# Patient Record
Sex: Male | Born: 1965 | Race: White | Hispanic: No | Marital: Single
Health system: Midwestern US, Community
[De-identification: ages and names within clinical notes are randomized; demographics above are authoritative.]

## PROBLEM LIST (undated history)

## (undated) DIAGNOSIS — R079 Chest pain, unspecified: Secondary | ICD-10-CM

## (undated) DIAGNOSIS — E785 Hyperlipidemia, unspecified: Secondary | ICD-10-CM

## (undated) DIAGNOSIS — I2510 Atherosclerotic heart disease of native coronary artery without angina pectoris: Secondary | ICD-10-CM

## (undated) DIAGNOSIS — I10 Essential (primary) hypertension: Secondary | ICD-10-CM

## (undated) DIAGNOSIS — R002 Palpitations: Secondary | ICD-10-CM

---

## 2001-07-07 HISTORY — PX: CORONARY STENT INTERVENTION: CATH118234

## 2014-01-10 LAB — PROTHROMBIN TIME + INR
INR: 1.1
Prothrombin time: 11.3 s (ref 9.8–12.0)

## 2014-01-10 LAB — EKG, 12 LEAD, INITIAL
Atrial Rate: 62 {beats}/min
Calculated P Axis: 69 degrees
Calculated R Axis: 82 degrees
Calculated T Axis: 72 degrees
Diagnosis: NORMAL
P-R Interval: 136 ms
Q-T Interval: 442 ms
QRS Duration: 120 ms
QTC Calculation (Bezet): 448 ms
Ventricular Rate: 62 {beats}/min

## 2014-01-10 LAB — CBC WITH AUTOMATED DIFF
ABS. BASOPHILS: 0 10*3/uL (ref 0.0–0.2)
ABS. EOSINOPHILS: 0.1 10*3/uL (ref 0.0–0.7)
ABS. LYMPHOCYTES: 0.9 10*3/uL — ABNORMAL LOW (ref 1.2–3.4)
ABS. MONOCYTES: 0.7 10*3/uL — ABNORMAL LOW (ref 1.1–3.2)
ABS. NEUTROPHILS: 3.6 10*3/uL (ref 1.4–6.5)
BASOPHILS: 1 % (ref 0–2)
EOSINOPHILS: 1 % (ref 0–5)
HCT: 31.7 % — ABNORMAL LOW (ref 36.8–45.2)
HGB: 10.4 g/dL — ABNORMAL LOW (ref 12.8–15.0)
IMMATURE GRANULOCYTES: 0.2 % (ref 0.0–5.0)
LYMPHOCYTES: 17 % (ref 16–40)
MCH: 29.3 PG (ref 27–31)
MCHC: 32.8 g/dL (ref 32–36)
MCV: 89.3 FL (ref 81–99)
MONOCYTES: 13 % — ABNORMAL HIGH (ref 0–12)
MPV: 9.6 FL (ref 7.4–10.4)
NEUTROPHILS: 68 % (ref 40–70)
PLATELET: 189 10*3/uL (ref 140–450)
RBC: 3.55 M/uL — ABNORMAL LOW (ref 4.0–5.2)
RDW: 12.8 % (ref 11.5–14.5)
WBC: 5.2 10*3/uL (ref 4.8–10.8)

## 2014-01-10 LAB — METABOLIC PANEL, COMPREHENSIVE
A-G Ratio: 1.2 (ref 1.0–3.1)
ALT (SGPT): 32 U/L (ref 12.0–78.0)
AST (SGOT): 37 U/L (ref 15–37)
Albumin: 4.2 g/dL (ref 3.40–5.00)
Alk. phosphatase: 58 U/L (ref 46–116)
Anion gap: 12 mmol/L (ref 10–17)
BUN/Creatinine ratio: 18 (ref 6.0–20.0)
BUN: 22 MG/DL — ABNORMAL HIGH (ref 7–18)
Bilirubin, total: 0.5 MG/DL (ref 0.20–1.00)
CO2: 28 mmol/L (ref 21–32)
Calcium: 8.7 MG/DL (ref 8.5–10.1)
Chloride: 102 mmol/L (ref 98–107)
Creatinine: 1.2 MG/DL (ref 0.6–1.3)
GFR est AA: >60 mL/min/{1.73_m2} (ref >60)
GFR est non-AA: >60 mL/min/{1.73_m2} (ref >60)
Globulin: 3.5 g/dL
Glucose: 108 mg/dL — ABNORMAL HIGH (ref 74–106)
Potassium: 4.3 mmol/L (ref 3.50–5.10)
Protein, total: 7.7 g/dL (ref 6.40–8.20)
Sodium: 138 mmol/L (ref 136–145)

## 2014-01-10 LAB — PTT: aPTT: 30.8 s (ref 24.5–31.6)

## 2014-01-10 LAB — TROPONIN I: Troponin-I: <0.05 ng/mL (ref <0.05)

## 2014-01-10 MED ORDER — SODIUM CHLORIDE 0.9 % IJ SYRG
Freq: Three times a day (TID) | INTRAMUSCULAR | Status: DC
Start: 2014-01-10 — End: 2014-01-10

## 2014-01-10 MED ORDER — SODIUM CHLORIDE 0.9 % IJ SYRG
INTRAMUSCULAR | Status: DC | PRN
Start: 2014-01-10 — End: 2014-01-10
  Administered 2014-01-10: 11:00:00 via INTRAVENOUS

## 2014-01-10 MED ORDER — MORPHINE 4 MG/ML SYRINGE
4 mg/mL | INTRAMUSCULAR | Status: DC | PRN
Start: 2014-01-10 — End: 2014-01-10

## 2014-01-10 MED ORDER — FAMOTIDINE (PF) 20 MG/2 ML IV
20 mg/2 mL | INTRAVENOUS | Status: AC
Start: 2014-01-10 — End: 2014-01-10
  Administered 2014-01-10: 12:00:00 via INTRAVENOUS

## 2014-01-10 MED ORDER — ASPIRIN 81 MG CHEWABLE TAB
81 mg | ORAL | Status: AC
Start: 2014-01-10 — End: 2014-01-10
  Administered 2014-01-10: 12:00:00 via ORAL

## 2014-01-10 MED FILL — SODIUM CHLORIDE 0.9 % IJ SYRG: INTRAMUSCULAR | Qty: 20

## 2014-01-10 MED FILL — SODIUM CHLORIDE 0.9 % INJECTION: INTRAMUSCULAR | Qty: 10

## 2014-01-10 MED FILL — ASPIRIN 81 MG CHEWABLE TAB: 81 mg | ORAL | Qty: 4

## 2014-01-10 MED FILL — MORPHINE 4 MG/ML SYRINGE: 4 mg/mL | INTRAMUSCULAR | Qty: 1

## 2014-01-10 NOTE — ED Notes (Signed)
Pt c/o L side CP radiates to left arm intermittent + nausea denies SOB describes pain as a pressure. EKG done and shown to Dr. Roxan Hockeyobinson

## 2014-01-10 NOTE — ED Notes (Signed)
Assumed care of patient,. Patient is alert and oriented times three.  Patient on monitor which shows sinus bradycardia without ectopy.

## 2014-01-10 NOTE — ED Provider Notes (Signed)
HPI Comments: Dennis Barrett is a 48 y.o. male who presents to the ED with complaint of CP since 04:30 this morning. He describes the pain as sharp and located in the substernal region of his chest. He also c/o mild SOB with his CP, but he denies N/V, diaphoresis, palpitations, dizziness, anxiety, and syncope. He took 3 SL NTG this morning with only minimal relief of his symptoms. He states that it took "the edge" off of his pain. He reports that he has been routinely treated for the past few years in [place]. The pt has a h/o CAD, 2 MI's, and 3 cardiac stents. The pt had a stress test within the last year that was slightly abnormal, but did not require any changes in his medical management or medical treatment at that time.    Patient is a 48 y.o. male presenting with chest pain. The history is provided by the patient. No language interpreter was used.   Chest Pain (Angina)   This is a new problem. The current episode started 3 to 5 hours ago. The problem has been gradually improving. The problem occurs constantly. The pain is present in the substernal region. The pain is moderate. The quality of the pain is described as sharp. The pain does not radiate. Associated symptoms include shortness of breath. Pertinent negatives include no abdominal pain, no back pain, no cough, no diaphoresis, no fever, no headaches, no leg pain, no malaise/fatigue, no nausea, no near-syncope, no numbness, no palpitations, no vomiting and no weakness. He has tried nitroglycerin for the symptoms. The treatment provided mild relief. Risk factors include cardiac disease. Procedural history includes cardiac catheterization, persantine thallium, exercise treadmill test and cardiac stents.       Past Medical History   Diagnosis Date   ??? MI (myocardial infarction) (HCC)    ??? Hypertension    ??? Hyperlipemia         History reviewed. No pertinent past surgical history.      History reviewed. No pertinent family history.     History      Social History   ??? Marital Status: SINGLE     Spouse Name: N/A     Number of Children: N/A   ??? Years of Education: N/A     Occupational History   ??? Not on file.     Social History Main Topics   ??? Smoking status: Never Smoker    ??? Smokeless tobacco: Not on file   ??? Alcohol Use: No   ??? Drug Use: No   ??? Sexual Activity: Not on file     Other Topics Concern   ??? Not on file     Social History Narrative   ??? No narrative on file                  ALLERGIES: Pcn      Review of Systems   Constitutional: Negative.  Negative for fever, chills, malaise/fatigue and diaphoresis.   HENT: Negative.  Negative for congestion, ear pain, sore throat and trouble swallowing.    Eyes: Negative.  Negative for photophobia, pain, redness and visual disturbance.   Respiratory: Positive for shortness of breath. Negative for cough, chest tightness and wheezing.    Cardiovascular: Positive for chest pain. Negative for palpitations and near-syncope.   Gastrointestinal: Negative.  Negative for nausea, vomiting, abdominal pain, diarrhea and blood in stool.   Genitourinary: Negative for dysuria and frequency.   Musculoskeletal: Positive for arthralgias (Chronic LEFT knee  pain). Negative for back pain, joint swelling and neck pain.   Skin: Negative.    Neurological: Negative.  Negative for seizures, syncope, weakness, numbness and headaches.   Psychiatric/Behavioral: Negative.  Negative for behavioral problems and confusion. The patient is not nervous/anxious.    All other systems reviewed and are negative.      Filed Vitals:    01/10/14 0822   BP: 112/75   Pulse: 59   Resp: 19   Height: 5\' 11"  (1.803 m)   Weight: 77.111 kg (170 lb)   SpO2: 100%            Physical Exam   Constitutional: He is oriented to person, place, and time.   Thin male   HENT:   Head: Normocephalic and atraumatic.   Mouth/Throat: No oropharyngeal exudate.   Eyes: Conjunctivae and EOM are normal. Pupils are equal, round, and  reactive to light. Right eye exhibits no discharge. Left eye exhibits no discharge. No scleral icterus.   Neck: Normal range of motion. Neck supple.   Cardiovascular: Normal rate, regular rhythm and normal heart sounds.  Exam reveals no gallop and no friction rub.    No murmur heard.  Pulmonary/Chest: Effort normal and breath sounds normal. No respiratory distress. He has no wheezes. He has no rales.   Abdominal: Soft. Bowel sounds are normal. He exhibits no distension. There is no tenderness. There is no rebound and no guarding.   Musculoskeletal: Normal range of motion. He exhibits no edema or tenderness.   Neurological: He is alert and oriented to person, place, and time. No cranial nerve deficit. Coordination normal.   Pt ambulatory without difficulty.    5/5 BUE/BLE strength.    Cranial nerves II-XII intact.    Speech normal.    Sensations intact.     Skin: Skin is warm and dry. No rash noted.   Psychiatric: He has a normal mood and affect. His behavior is normal. Judgment and thought content normal.   Nursing note and vitals reviewed.       MDM  Number of Diagnoses or Management Options  Chest pain, unspecified chest pain type:   Diagnosis management comments: 48 y.o. male with a h/o CAD who presents with an episode of CP beginning approximately 2 hours ago. Pt is not forthcoming with his medical history. I observed old Tegaderm markings and EKG sites on the pt's chest wall. When asked about those markings, the pt sates that those marks are from a hospital visit 3 months ago. The pt's medical records were reviewed in [facility], and the pt has been to three hospitals over the past 24 hours, and 5 hospitals over the past 2 days. During each of these subsequent visits, the pt has had multiple enzymes drawn over the past 24 hours. He has also had multiple stress tests and MRI's of the brain within the past few months. [place] [address] within the past 3-5 months, and has had negative cardiac work-ups at those sites.     Will order a set of troponin enzymes to r/o acute MI. However, with the pt's lack of forthcoming information and recent normal cardiac tests, cardiac disease is of low concern. Will ask the pt if he has come to the ED for secondary gain or for secondary reasons.      9:39 AM  The pt's troponin enzymes are negative. The pt has had other troponin enzymes drawn  over the past 24 hours at 2 other hospital facilities. Considering these normal serial troponins at different hospitals within the past 24 hours, the pt has been ruled out for acute MI. Therefore, there is no reason at this time to obtain a repeat troponin during his visit in the ED today.       Plan:   CBC  BMP  Troponin  EKG  CXR  Monitor for three hours  Reassess  -----  7:19 AM  Documented by Alric Seton, acting as a scribe for Dr. Saundra Shelling.    PROVIDER ATTESTATION:  11:08 AM    The entirety of this note, signed by me, accurately reflects all works, treatments, procedures, and medical decision making performed by me, Lawana Chambers, MD.              Patient Progress  Patient progress: stable      Diagnostic Studies:  EKG 7:09 AM   Viewed by me  Interpreted by me  Abnormal as below:    Normal Sinus Rhythm   Normal rate    Normal Axis   Normal ST/T waves   Normal QRS   Normal Intervals   Tachycardic @ rate of:            bpm   Bradycardic @ rate of:             bpm   Nonspecific T-wave inversions   Nonspecific ST changes    LVH     RBBB  LBBB  Paced   Interpretation: 62 NSR; TWI in V1 and V2; no signs of ischemia    CXR 7:09 AM    Viewed by me     Interpreted by me   No acute disease    Abnormal : see below    Discussed with       Radiologist      Normal mediastinum  No infiltrates   Normal heart size   Interpretation: NAD; loop recorder present under the skin      Lab Data:  Labs Reviewed    METABOLIC PANEL, COMPREHENSIVE - Abnormal; Notable for the following:     Glucose 108 (*)     BUN 22 (*)     All other components within normal limits   CBC WITH AUTOMATED DIFF - Abnormal; Notable for the following:     RBC 3.55 (*)     HGB 10.4 (*)     HCT 31.7 (*)     MONOCYTES 13 (*)     ABS. LYMPHOCYTES 0.9 (*)     ABS. MONOCYTES 0.7 (*)     All other components within normal limits   TROPONIN I   PTT   PROTHROMBIN TIME + INR         ED Course:   7:09 AM  [facility] records reviewed. D/w pt that these records were reviewed. D/w pt that the troponin enzymes drawn at [facility] are not consistent with a CC of knee pain, as he states he was at [facility] for knee pain. The pt is not forthcoming with his medical hx or his secondary reasons for coming to the ED at [facility] today. The pt had a normal stress test during an exertional treadmill study on 11/09/13 at 60% of maximum potential HR. The pt was also seen at [facility] for knee pain 10 hours ago. The pt had a persantine thallium stress test at [facility] in March, 2015 that was also normal.  Within the past 24 hours, the pt has been to 3 hospitals in the [place] area, and 5 hospitals over the past 2 days.     9:35 AM  The pt's troponin enzymes are negative. The pt has had other troponin enzymes drawn over the past 24 hours at 2 other facilities. Considering these normal serial troponins at different hospitals within the past 24 hours, the pt has been ruled out for acute MI. Therefore, there is no reason at this time to obtain a repeat troponin during his visit in the ED today.     Procedures

## 2014-01-10 NOTE — ED Notes (Signed)
I have reviewed discharge instructions with the patient.  The patient verbalized understanding.

## 2014-01-10 NOTE — ED Notes (Signed)
Patient remains pain free, given warm blankets and a meal to consume.  Patient awaiting reassessment by physician after all lab results are obtained.Patient remain on cardiac monitor which shows sinus bradycardia without ectopy.

## 2014-01-12 ENCOUNTER — Inpatient Hospital Stay
Admit: 2014-01-12 | Discharge: 2014-01-15 | Disposition: A | Discharge status: Home / Self Care | Payer: MEDICARE | Source: Other Acute Inpatient Hospital | Attending: Addiction Medicine | Admitting: Addiction Medicine

## 2014-01-12 DIAGNOSIS — F3289 Other specified depressive episodes: Secondary | ICD-10-CM

## 2014-01-12 NOTE — Progress Notes (Signed)
Admission  Note :                         Pt, a 48 year old caucasian male was voluntarily admitted to [facility] unit from [facility]) this evening. Pt was already on the unit when this nurse assumed duty. Per report from [facility] ED, pt self presents to the ED with complaints of increased depression and SI. Pt caught his girlfriend of 7 years in bed with another man the previous day before presenting to the ED. Pt also reported he has been working long days, 10-16 hours per day every day and he is feeling overworked,  exhausted and "run down". 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 Pt was pleasant upon approach. He was cooperative and calm with the admission process. Denies SI/HI at this time. Pt also denies AH/VH. Per pt, he said he feels more overwhelmed with work than feeling depressed. He complained of decreased sleep as he has been sleeping for only 1-2 hours per day and would like to get some rest. This is pt's first in-patient hospitalization as he had no prior in-patient and out-patient treatment for behavioral health. Pt also has no history of alcohol or substance abuse and denies use of any. Pt's PMHX is significant for hypertension, coronary artery disease,  and myocardial infarction. He does take medications for these somatic issues and is compliant. Dr. Freida Busman was notified of pt's arrival on the unit and of the admission assessment and admission order was placed.

## 2014-01-13 MED ADMIN — simvastatin (ZOCOR) tablet 40 mg: ORAL | @ 16:00:00 | NDC 68084051211

## 2014-01-13 MED ADMIN — clopidogrel (PLAVIX) tablet 75 mg: ORAL | @ 16:00:00 | NDC 00904629461

## 2014-01-13 MED ADMIN — metoprolol (LOPRESSOR) tablet 25 mg: ORAL | @ 16:00:00 | NDC 62584026511

## 2014-01-13 MED FILL — CLOPIDOGREL 75 MG TAB: 75 mg | ORAL | Qty: 1

## 2014-01-13 MED FILL — SIMVASTATIN 20 MG TAB: 20 mg | ORAL | Qty: 2

## 2014-01-13 MED FILL — METOPROLOL TARTRATE 25 MG TAB: 25 mg | ORAL | Qty: 1

## 2014-01-13 NOTE — H&P (Addendum)
DATE OF ADMISSION: 01/12/2014    ATTENDING PHYSICIAN: Reather Littler, MD    INFORMATION SOURCE: Chart reviewed, patient interviewed.    CHIEF COMPLAINT: "I caught my girlfriend cheating."    HISTORY OF PRESENT ILLNESS: The patient is a 48 year old, single,Caucasian  male who presented to [facility] [place].  Dennis Barrett, transferred from [facility] where he  self-presented from a friend's job via walking to [facility] for patient's  complaints of feeling depressed, exhausted, puzzled and disappointed for  approximately 24 hours, with suicidal ideation without plan. The patient  denies any history of suicidal ideation or attempts in the past. He denied  any neurovegetative symptoms. He endorsed recent stressors of girlfriend  cheating. They have been together for 7 years. The cheating event, he  reports, took place the night before this admission. 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 He stepped out of the hotel and, when he returned found his  Ex-significant other"in bed with a man he did not know." The patient reports that he  immediately left the room and engaged the advice of his friend. The patient  reports that since this event his son's have removed his belongings and are  traveling back with the construction crew to his home state of [place]. The patient reports he plans to have no further association with  his now ex-significant other.    PAST PSYCHIATRIC HISTORY: The patient denies.    PAST MEDICAL HISTORY: MI x2, hypercholesterolemia, hypertension.    MEDICATIONS  1. Plavix 75 mg a day.  2. Lopressor 25 mg daily.  3. Zocor 40 mg daily.  4. Aspirin 81 mg daily.    ALLERGIES: PENICILLIN.    FAMILY PSYCHIATRIC HISTORY: None.    SOCIAL HISTORY: The patient resided in house with ex-significant other in   [place]. Employed. 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 No legal  concerns. Two years of college education. No military history. Never  married, 6 children.    SUBSTANCE USE HISTORY: The patient denies.    MENTAL STATUS EXAMINATION ON ADMISSION: The patient's gait is stable.  Oriented to person, place, time and situation. Appearance: Mildly  disheveled. Eye contact: Good. Attitude: Initially guarded, then within  normal limits. Behavior: Normal. Speech: Normal. Mood: Mildly anxious.  Affect: Full. Thought process: Goal directed. Abstraction: Intact.  Associations: Intact. Thought content with suicidal ideation without plan  at time of admission. Homicidal ideation: None. Delusions: None.  Perceptions: None. Attention and concentration: Intact. Fund of knowledge:  Appropriate. Memory: Intact. Insight: Blaming. Judgment: Psychologically  impaired. Language: Age appropriate.    ASSESSMENT  AXIS I: Depressive disorder, unspecified.  AXIS II: Deferred.  AXIS III  1. MI x2.  2. Hypercholesterolemia.  3. Hypertension.  AXIS IV: Recent alleged infidelity of significant other of 7 years.  AXIS V: 35-40.    PLAN: Admit to [facility]. Constant observation. Somatic evaluation.  Psychosocial assessment. Encourage verbalization of feelings. Obtain  collateral information. Initiate disposition plan.        Reather Littler MD        Dictated by: Mallory Shirk Martena Emanuele]  Job#: [[id number]]  DD: [01/13/2014 02:02 P]  DT: [01/13/2014 02:32 P]  Transcribed by: [wmx]    cc:            Alvan Culpepper,MD Freida Busman

## 2014-01-13 NOTE — Other (Signed)
Bedside and Verbal shift change report given by MOJISOLA A. SOFOLUKE   (offgoing nurse).  Report given with SBAR, Kardex and MAR.

## 2014-01-13 NOTE — H&P (Signed)
Pt interviewed for H&P.    Luc Shammas, MD

## 2014-01-13 NOTE — Consults (Signed)
ATTENDING PHYSICIAN: Reather Littlerhonda Allen, MD    CARDIOLOGIST: [facility]obs [facility].    CONSULTING PHYSICIAN: Gustavus MessingSteven Hamlette, MD    REASON FOR CONSULTATION: Medical management.    HISTORY OF PRESENT ILLNESS: The patient is a 48 year old male with past  medical history significant for CAD, hypertension, hyperlipidemia, who  presented to the emergency department with complaint of increasing  depression and suicidal ideation. The patient was transferred to the  psychiatric unit here at [facility] for further evaluation and management.  Today, the patient denies any suicidal or homicidal ideations. He also  denies any chest pain, shortness of breath, nausea, vomiting, diarrhea, or  abdominal pain.    PAST MEDICAL HISTORY: CAD, hypertension, hyperlipidemia.    PAST SURGICAL HISTORY: The patient denies any surgery.    FAMILY HISTORY: Positive for hypertension.    SOCIAL HISTORY: The patient denies any tobacco, alcohol, or illicit drug  use.    MEDICATIONS  1. Plavix 75 mg by mouth daily.  2. Atorvastatin 40 mg by mouth daily.  3. Metoprolol 100 mg by mouth b.i.d.  4. Aspirin 81 mg by mouth daily.    ALLERGIES: NO KNOWN DRUG ALLERGIES.    REVIEW OF SYSTEMS: A 10-point review of systems was reviewed with the  patient and otherwise negative except as noted above in the HPI.    PHYSICAL EXAMINATION  VITAL SIGNS: Temperature 98.1, pulse 62, respiratory rate 20, blood  pressure 112/72. Oxygen saturation 100 on room air.  GENERAL: A well-developed, well-nourished 48 year old male who appears to  be in no acute distress.  HEENT: Atraumatic, normocephalic. Pupils equal, round, and reactive to  light and accommodation. EOMs intact. Poor dentition.  NECK: Supple. No JVD, lymphadenopathy, no thyromegaly, no carotid bruits.  CHEST: No deformities.  CARDIOVASCULAR: S1, S2, regular rate and rhythm.  RESPIRATORY: Clear to auscultation bilaterally.  No wheezes or rhonchi.   GASTROINTESTINAL: Abdomen soft, nontender, nondistended. Positive bowel  sounds.  MUSCULOSKELETAL: Full range of motion intact.  EXTREMITIES: No edema, cyanosis, or clubbing.  PSYCHIATRIC: Appropriate mood.  SKIN: No apparent lesions.  NEUROLOGIC: The patient is alert and oriented x3. No apparent focal  deficits.    LABORATORY DATA: WBC 5.2, hemoglobin 10.4, hematocrit 31.7, platelets 189.  Sodium 138, potassium 4.3, chloride 102, CO2 28, anion gap 12, glucose 108,  BUN 22, creatinine 1.2, calcium 8.7, total protein 7.7, albumin 4.2, ALT  32, AST 37, alkaline phosphatase 58. Total protein 7.7, albumin 4.2, ALT  32, AST 37, alkaline phosphatase 58. Troponin less than 0.05. INR 1.1,  prothrombin time 11.3, PTT 30.8.    Chest x-ray revealed no evidence of active disease.    EKG tracing reviewed and revealed normal sinus rhythm at a rate of 62.    CODE STATUS: FULL CODE.    ASSESSMENT  1. Renal insufficiency, likely secondary to dehydration.  2. Dehydration.  3. Coronary artery disease.  4. Hyperlipidemia.  5. Normocytic anemia. Hemoglobin and hematocrit stable.  6. Hypertension.  7. Mood disorder.  8. Depressive disorder.  9. Suicidal ideations.    Note, all the above diagnoses were present on admission.    RECOMMENDATIONS: This is a patient who presented with renal insufficiency.  BUN is 22 and creatinine 1.2, likely secondary to dehydration. We will  encourage oral hydration. We will monitor kidney functions closely. The  patient also has a history of anemia, but H and H is currently stable.  There is no sign of active bleeding. We will monitor closely. We will  resume the patient's Plavix. We will resume statin for hyperlipidemia and  metoprolol for blood pressure and will monitor blood pressure very closely.  His blood pressure is currently well controlled. The psychiatric diagnoses  mentioned above are being managed by the psychiatric team.    On behalf of [facility] hospitalist team, thank you for this consultation.         Ramonita Koenig PA-C        Dictated by: Lavella Hammock[Leeta Grimme, PA-C Lyndzee Kliebert]  Job#: [[id number]]: [id number]]  DD: [01/13/2014 04:36 P]  DT: [01/13/2014 05:10 P]  Transcribed by: [wmx]    cc:            Rhonda,MD Crissie ReeseAllen                 Shantil Vallejo, PA-C Aaric Dolph

## 2014-01-13 NOTE — Progress Notes (Signed)
01/13/14 2031   Attitude and Behavior   General Attitude Cooperative   Affect Angry   Mood Angry;Anxious   Insight Fair   Judgement Impaired   Memory  Intact   Thought Content Delusions   Hallucinations None   Delusions Paranoid   Concentration Fair   Speech Pattern Unremarkable   Thought Process Unremarkable   Motor Activity Unremarkable   pt is alert and oriented x3, pt is paranoid and delusional, pt reports he is not happy with the decision to pull down the confederate flag in [place]. Pt reports he "knows better than those people do".pt reports the flag is very important in the history of [place], and should be left alone. Pt reports he will have to do something about the flag when he returns to [place] [place]. Pt was visible in the milieu last night, interacted appropriately with selected peers, pt denies pain and  si/hi this shift. Pt slept through the night, will continue to provide for safety, Q5215mins checks maintained.

## 2014-01-13 NOTE — Progress Notes (Signed)
01/13/14 1553   Attitude and Behavior   General Attitude Cooperative   Affect Flat;Dull;Sad   Mood Depressed   Insight Fair   Judgement Impaired   Memory  Intact   Thought Content Unremarkable   Hallucinations None   Delusions None   Concentration Fair   Speech Pattern Unremarkable   Thought Process Unremarkable   Motor Activity Unremarkable     Assumed care of patient this am; pt is  alert and oriented x 3. Patient is visible in the milieu but o self, observed sitting in the dayarea watching TV at times; Denies SI/HI/AV hallucinations currently; able to contract for safety on the unit; compliant with scheduled medications; ate 100% of meals; no safety or behavior issues noted or reported; Will continue to monitor.

## 2014-01-13 NOTE — Progress Notes (Addendum)
Pt denies use of any illegal substance. However, his urine toxicology was positive for opiates and oxycodone.

## 2014-01-13 NOTE — Progress Notes (Signed)
Pt rested uninterrupted through the night for approximately 6. 5 hours. Pt woke up at 7:07 am asking for breakfast. Pt was told to remain in bed till 8:00 am when breakfast will be served.

## 2014-01-13 NOTE — Consults (Signed)
Consult dictated. Job ID#: [id number]

## 2014-01-14 LAB — METABOLIC PANEL, BASIC
Anion gap: 12 mmol/L (ref 10–17)
BUN/Creatinine ratio: 13 (ref 6.0–20.0)
BUN: 14 MG/DL (ref 7–18)
CO2: 27 mmol/L (ref 21–32)
Calcium: 8.2 MG/DL — ABNORMAL LOW (ref 8.5–10.1)
Chloride: 110 mmol/L — ABNORMAL HIGH (ref 98–107)
Creatinine: 1.1 MG/DL (ref 0.6–1.3)
GFR est AA: >60 mL/min/{1.73_m2} (ref >60)
GFR est non-AA: >60 mL/min/{1.73_m2} (ref >60)
Glucose: 101 mg/dL (ref 74–106)
Potassium: 4.1 mmol/L (ref 3.50–5.10)
Sodium: 145 mmol/L (ref 136–145)

## 2014-01-14 MED ADMIN — metoprolol (LOPRESSOR) tablet 25 mg: ORAL | @ 13:00:00 | NDC 62584026511

## 2014-01-14 MED ADMIN — clopidogrel (PLAVIX) tablet 75 mg: ORAL | @ 13:00:00 | NDC 68084053611

## 2014-01-14 MED ADMIN — aspirin delayed-release tablet 81 mg: ORAL | @ 13:00:00 | NDC 00603002622

## 2014-01-14 MED ADMIN — simvastatin (ZOCOR) tablet 40 mg: ORAL | @ 13:00:00 | NDC 68084051211

## 2014-01-14 MED FILL — METOPROLOL TARTRATE 25 MG TAB: 25 mg | ORAL | Qty: 1

## 2014-01-14 MED FILL — SIMVASTATIN 20 MG TAB: 20 mg | ORAL | Qty: 2

## 2014-01-14 MED FILL — CLOPIDOGREL 75 MG TAB: 75 mg | ORAL | Qty: 1

## 2014-01-14 MED FILL — ASPIRIN 81 MG TAB, DELAYED RELEASE: 81 mg | ORAL | Qty: 1

## 2014-01-14 NOTE — Other (Signed)
Bedside and Verbal shift change report given to  (oncoming nurse) by Eric A Amene, RN (offgoing nurse). Report included the following information SBAR, Kardex and MAR.

## 2014-01-14 NOTE — Progress Notes (Signed)
Pt alert and pleasant; cooperative with assessment.  He is med-compliant and visible in the milieu.  He interacts with his peers.  Pt says that he is here due to feeling depressed and suicidal.  He says that he no longer feels that way because he has gotten some rest.  Denies feeling depressed and states, "I feel good."  No paranoia or delusions noted.  Will continue q15 mins close observation.

## 2014-01-14 NOTE — Progress Notes (Signed)
Bedside shift change report given by NINA  POLLEY, RN   (offgoing nurse).  Report given with SBAR and Kardex.

## 2014-01-14 NOTE — Progress Notes (Signed)
PSYCHIATRY INPATIENT PROGRESS NOTE    SUBJECTIVE/Objective: Pt presents with mild dysphoria, mildly restricted affect and reports continued dismay about SO infidelity.  He is able to verbalize his feelings. He denies SI, HI or AH.  Pt hopes to relocate to [facility] at discharge.  SW for dispo plan.    Current facility-administered medications: metoprolol (LOPRESSOR) tablet 25 mg, 25 mg, Oral, DAILY, Reather Littler, MD, 25 mg at 01/14/14 0840;  clopidogrel (PLAVIX) tablet 75 mg, 75 mg, Oral, DAILY, Reather Littler, MD, 75 mg at 01/14/14 0840;  simvastatin (ZOCOR) tablet 40 mg, 40 mg, Oral, DAILY, Reather Littler, MD, 40 mg at 01/14/14 0840;  aspirin delayed-release tablet 81 mg, 81 mg, Oral, DAILY, Reather Littler, MD, 81 mg at 01/14/14 0840      Allergies   Allergen Reactions   ??? Pcn [Penicillins] Hives        MENTAL STATUS EXAMINATION : The patient's gait is stable.  Oriented to person, place, time and situation. Appearance: Mildly  disheveled. Eye contact: Good. Attitude: less guarded, then within  normal limits. Behavior: Normal. Speech: Normal. Mood: Mildly anxious.  Affect: Full. Thought process: Goal directed. Abstraction: Intact.  Associations: Intact. Thought content without suicidal ideation. Homicidal ideation: None. Delusions: None.  Perceptions: None. Attention and concentration: Intact. Fund of knowledge:  Appropriate. Memory: Intact. Insight: Blaming. Judgment: Psychologically  impaired. Language: Age appropriate.        Labs reviewed.  Blood pressure 115/77, pulse 58, temperature 97.3 ??F (36.3 ??C), resp. rate 18, height 5' 7.75" (1.721 m), weight 64 kg (141 lb 1.5 oz), SpO2 96 %.      IMPRESSION AND PLAN:   Patient Active Problem List   Diagnosis Code   ??? Mood disorder (HCC) 296.90       1. Continue inpatient psychiatric hospitalization on level  routine precautions.  2. Continue current medications,discussed medication risk benefit profile and potential side effects.    3. Medication reconciliation completed with the patient and within the EMR. Medication education supportive psychotherapy. MH Relapse prevention eduction, continue to build rapport.  4. Continue discharge planning.     Reather Littler, MD   01/14/2014 10:32 AM

## 2014-01-15 MED ORDER — METOPROLOL TARTRATE 25 MG TAB
25 mg | ORAL_TABLET | Freq: Every day | ORAL | Status: AC
Start: 2014-01-15 — End: ?

## 2014-01-15 MED ADMIN — clopidogrel (PLAVIX) tablet 75 mg: ORAL | @ 12:00:00 | NDC 68084053611

## 2014-01-15 MED ADMIN — simvastatin (ZOCOR) tablet 40 mg: ORAL | @ 12:00:00 | NDC 68084051211

## 2014-01-15 MED ADMIN — aspirin delayed-release tablet 81 mg: ORAL | @ 12:00:00 | NDC 00603002622

## 2014-01-15 MED ADMIN — metoprolol (LOPRESSOR) tablet 25 mg: ORAL | @ 12:00:00 | NDC 62584026511

## 2014-01-15 MED FILL — ASPIRIN 81 MG TAB, DELAYED RELEASE: 81 mg | ORAL | Qty: 1

## 2014-01-15 MED FILL — METOPROLOL TARTRATE 25 MG TAB: 25 mg | ORAL | Qty: 1

## 2014-01-15 MED FILL — CLOPIDOGREL 75 MG TAB: 75 mg | ORAL | Qty: 1

## 2014-01-15 MED FILL — SIMVASTATIN 20 MG TAB: 20 mg | ORAL | Qty: 2

## 2014-01-15 NOTE — Other (Signed)
Bedside shift change report given by Timothy Udoji, RN   (offgoing nurse).  Report given with SBAR, Kardex and MAR.

## 2014-01-15 NOTE — Progress Notes (Signed)
D/C NOTE:  Pt has been escorted off the unit by this Clinical research associate.  All personal belongings have been released to pt.  He has been transported to [place] bus terminal by taxicab.  He is in agreement with aftercare plan.

## 2014-01-15 NOTE — Behavioral Health Treatment Team (Signed)
BEHAVIORAL HEALTH NURSING DISCHARGE NOTE      The following personal items collected during your admission are returned to you:   Dental Appliance: Dental Appliances: None  Vision: Visual Aid: None  Hearing Aid:    Jewelry: Jewelry: None  Clothing: Clothing: None  Other Valuables: Other Valuables: None  Valuables sent to safe:        PATIENT INSTRUCTIONS:      The discharge information has been reviewed with the patient.  The patient verbalized understanding.

## 2014-01-15 NOTE — Progress Notes (Signed)
Pt alert and pleasant; cooperative with interview.  He is for discharge today.  Denies SI,HI, denies suicide plan.  Denies aud and vis hallucinations.  Pt denies feeling depressed.  Says he will go to [place] in [place] and from [place].  Pt is to follow up with Dr Christella HartiganJacobs in his home town and continue of his prior to admission meds.  Pt in agreement with aftercare plan.  Will continue q15 mins close observation.

## 2014-01-15 NOTE — Progress Notes (Signed)
01/15/14 0544   Attitude and Behavior   General Attitude Cooperative   Affect Dull   Mood Anxious   Insight Fair   Judgement Intact   Memory  Intact   Thought Content Unremarkable   Hallucinations None   Delusions None   Concentration Fair   Speech Pattern Slow   Thought Process Unremarkable   Motor Activity Freedom of movement   Pt was alert and oriented, was cooperative . He spent an ample length of time in the milieu watching TV. He denies SI/HI, stated that he is doing better. He retired to bed when the unit was shut. He had an uninterrupted nights rest, 15 minutes checks maintained.

## 2014-01-18 NOTE — Discharge Summary (Signed)
DATE OF DISCHARGE: 01/15/2014    ATTENDING PHYSICIAN: Reather Littlerhonda Sherae Santino, MD    REASON FOR ADMISSION:   CHIEF COMPLAINT: "I caught my girlfriend cheating."    HISTORY OF PRESENT ILLNESS: The patient is a 48 year old, single,Caucasian  male who presented to [facility], [facility] where he  self-presented from a friend's job via walking to [facility] for patient's  complaints of feeling depressed, exhausted, puzzled and disappointed for  approximately 24 hours, with suicidal ideation without plan. The patient  denies any history of suicidal ideation or attempts in the past. He denied  any neurovegetative symptoms. He endorsed recent stressors of girlfriend  cheating. They have been together for 7 years. The cheating event, he  reports, took place the night before this admission. The patient [place] in  [place] and has a [employer] that works, traveling around  the country, and recently they were doing a job in [place] lodged in a  hotel. He stepped out of the hotel and, when he returned found his  Ex-significant other"in bed with a man he did not know." The patient reports that he  immediately left the room and engaged the advice of his friend. The patient  reports that since this event his son's have removed his belongings and are  traveling back with the construction crew to [place]. The patient reports he plans to have no further association with  his now ex-significant other.    PAST PSYCHIATRIC HISTORY: The patient denies.    PAST MEDICAL HISTORY: MI x2, hypercholesterolemia, hypertension.          For full details, please see psychiatric assessment  completed by Dr. Freida BusmanAllen on 01/13/2014.    FINAL DIAGNOSES  AXIS I: Depressive disorder, unspecified.  AXIS II: Deferred.  AXIS III: Myocardial infarction x2, hypercholesterolemia, hypertension.   AXIS IV: Recent alleged infidelity of significant other of 7 years.  AXIS V: 35-40 on admission, 45-50 at discharge.    HOSPITAL COURSE: The patient was treated with individual psychotherapy. The  patient was able to integrate into the milieu without complications. When  it was noted that the patient was no longer a danger to himself or others,  and targeted symptoms could be treated in the community, it was the  decision of the treatment team that the patient's targeted symptoms could  be treated in the community.    DISCHARGE MEDICATIONS  1. Lopressor 25 mg 1 tablet by mouth daily.  2. Lipitor 40 mg 1 tablet by mouth daily.  3. Aspirin 81 mg 1 tablet by mouth daily.  4. Plavix one tablet by mouth daily.    DISPOSITION: Patient discharged to [employer] bus station to [place] resume [place] and to follow up with primary care  provider in [place]. The patient understood and agreed with  disposition plan.            Reather LittlerHONDA Brycen Bean MD        Dictated by: Mallory Shirk[Calista Crain,MD Davey Bergsma]  Job#: [[id number]]: [id number]]  DD: [01/17/2014 08:38 P]  DT: [01/18/2014 02:58 A]  Transcribed by: [wmx]    cc:            Hulet Ehrmann,MD Freida BusmanAllen

## 2014-02-15 LAB — EKG, 12 LEAD, INITIAL
Atrial Rate: 59 {beats}/min
Calculated P Axis: 71 degrees
Calculated R Axis: 76 degrees
Calculated T Axis: 66 degrees
P-R Interval: 128 ms
Q-T Interval: 454 ms
QRS Duration: 128 ms
QTC Calculation (Bezet): 449 ms
Ventricular Rate: 59 {beats}/min

## 2014-02-15 LAB — CBC WITH AUTOMATED DIFF
ABS. BASOPHILS: 0.1 10*3/uL (ref 0.0–0.1)
ABS. EOSINOPHILS: 0.5 10*3/uL — ABNORMAL HIGH (ref 0.0–0.4)
ABS. LYMPHOCYTES: 1.2 10*3/uL (ref 0.8–3.5)
ABS. MONOCYTES: 0.7 10*3/uL (ref 0.0–1.0)
ABS. NEUTROPHILS: 2.5 10*3/uL (ref 1.8–8.0)
BASOPHILS: 1 % (ref 0–1)
EOSINOPHILS: 11 % — ABNORMAL HIGH (ref 0–7)
HCT: 29.5 % — ABNORMAL LOW (ref 36.6–50.3)
HGB: 9.3 g/dL — ABNORMAL LOW (ref 12.1–17.0)
LYMPHOCYTES: 24 % (ref 12–49)
MCH: 28.3 PG (ref 26.0–34.0)
MCHC: 31.5 g/dL (ref 30.0–36.5)
MCV: 89.7 FL (ref 80.0–99.0)
MONOCYTES: 14 % — ABNORMAL HIGH (ref 5–13)
NEUTROPHILS: 50 % (ref 32–75)
PLATELET: 213 10*3/uL (ref 150–400)
RBC: 3.29 M/uL — ABNORMAL LOW (ref 4.10–5.70)
RDW: 14.4 % (ref 11.5–14.5)
WBC: 5 10*3/uL (ref 4.1–11.1)

## 2014-02-15 LAB — CK W/ CKMB & INDEX
CK - MB: 1.3 NG/ML (ref 0.5–3.6)
CK - MB: 0.9 NG/ML (ref 0.5–3.6)
CK-MB Index: 0.6 (ref 0–2.5)
CK-MB Index: 0.4 (ref 0–2.5)
CK: 236 U/L (ref 39–308)
CK: 201 U/L (ref 39–308)

## 2014-02-15 LAB — METABOLIC PANEL, COMPREHENSIVE
A-G Ratio: 1.1 (ref 1.1–2.2)
ALT (SGPT): 19 U/L (ref 12–78)
AST (SGOT): 20 U/L (ref 15–37)
Albumin: 3.7 g/dL (ref 3.5–5.0)
Alk. phosphatase: 57 U/L (ref 45–117)
Anion gap: 9 mmol/L (ref 5–15)
BUN/Creatinine ratio: 17 (ref 12–20)
BUN: 12 MG/DL (ref 6–20)
Bilirubin, total: 1 MG/DL (ref 0.2–1.0)
CO2: 25 mmol/L (ref 21–32)
Calcium: 8.8 MG/DL (ref 8.5–10.1)
Chloride: 106 mmol/L (ref 97–108)
Creatinine: 0.71 MG/DL (ref 0.45–1.15)
GFR est AA: >60 mL/min/{1.73_m2} (ref >60)
GFR est non-AA: >60 mL/min/{1.73_m2} (ref >60)
Globulin: 3.5 g/dL (ref 2.0–4.0)
Glucose: 87 mg/dL (ref 65–100)
Potassium: 3.1 mmol/L — ABNORMAL LOW (ref 3.5–5.1)
Protein, total: 7.2 g/dL (ref 6.4–8.2)
Sodium: 140 mmol/L (ref 136–145)

## 2014-02-15 LAB — EKG, 12 LEAD, SUBSEQUENT
Atrial Rate: 56 {beats}/min
Calculated P Axis: 67 degrees
Calculated R Axis: 78 degrees
Calculated T Axis: 67 degrees
P-R Interval: 126 ms
Q-T Interval: 460 ms
QRS Duration: 126 ms
QTC Calculation (Bezet): 443 ms
Ventricular Rate: 56 {beats}/min

## 2014-02-15 LAB — TROPONIN I
Troponin-I, Qt.: <0.04 ng/mL (ref <0.05)
Troponin-I, Qt.: <0.04 ng/mL (ref <0.05)

## 2014-02-15 LAB — TSH 3RD GENERATION: TSH: 0.68 u[IU]/mL (ref 0.36–3.74)

## 2014-02-15 LAB — PROTHROMBIN TIME + INR
INR: 1.1 (ref 0.9–1.1)
Prothrombin time: 11.4 s — ABNORMAL HIGH (ref 9.0–11.1)

## 2014-02-15 LAB — PTT: aPTT: 30.5 s (ref 22.1–32.5)

## 2014-02-15 LAB — D DIMER: D-dimer: 0.36 mg/L FEU (ref 0.00–0.65)

## 2014-02-15 LAB — D-DIMER, QUANTITATIVE: D-Dimer, Quant: 0.36 mg/L FEU (ref 0.00–0.65)

## 2014-02-15 MED ADMIN — nitroglycerin (NITROBID) 2 % ointment 1 Inch: TOPICAL | @ 08:00:00 | NDC 00281032608

## 2014-02-15 MED FILL — NITRO-BID 2 % TRANSDERMAL OINTMENT: 2 % | TRANSDERMAL | Qty: 1

## 2014-02-15 MED FILL — BAYER ASPIRIN 325 MG TABLET: 325 mg | ORAL | Qty: 1

## 2014-02-15 NOTE — ED Notes (Signed)
He was picked up at the bus station and says he was passing through and has been having chest pain for 2 days. He has radiation to his left arm.  Denies nausea or shortness of breath. He says he does not have any id as the person who was traveling with it has it.

## 2014-02-15 NOTE — ED Provider Notes (Addendum)
HPI Comments: tthe patient is a 48 year old male with a past medical history significant for hypertension, MI with stents x2, hypercholesterolemia and mood disorder who presents to the ED by EMS with evaluation of intermittent episodes of chest pain localized to the midsternal region, symptoms began approximately 6-8 hours ago while the patient was medically calm bus traveling and passing through [place]. He is totally pain-free and comfortable. He denies any fever, sore throat, cough, congestion, headache, neck pain, back pain, nausea vomiting, abdominal pain, diarrhea, constipation, dysuria, or hematuria, dizziness, weakness numbness, blurred vision skin rash, Arms or leg pain or swelling.    Patient is a 48 y.o. male presenting with chest pain.   Chest Pain (Angina)   The problem has been gradually worsening.        Past Medical History   Diagnosis Date   ??? MI (myocardial infarction) (HCC)    ??? Hypertension    ??? Hyperlipemia    ??? Mood disorder (HCC) 01/12/2014        No past surgical history on file.      No family history on file.     History     Social History   ??? Marital Status: SINGLE     Spouse Name: N/A     Number of Children: N/A   ??? Years of Education: N/A     Occupational History   ??? Not on file.     Social History Main Topics   ??? Smoking status: Never Smoker    ??? Smokeless tobacco: Not on file   ??? Alcohol Use: No   ??? Drug Use: No   ??? Sexual Activity: Not on file     Other Topics Concern   ??? Not on file     Social History Narrative                  ALLERGIES: Pcn      Review of Systems   Cardiovascular: Positive for chest pain.   All other systems reviewed and are negative.      Filed Vitals:    02/15/14 0340   BP: 131/76   Pulse: 58   Temp: 98.6 ??F (37 ??C)   Resp: 18   Height: 5\' 11"  (1.803 m)   Weight: 68.04 kg (150 lb)   SpO2: 100%            Physical Exam   Nursing note and vitals reviewed.       CONSTITUTIONAL: Well-appearing; well-nourished; in no apparent distress  HEAD: Normocephalic; atraumatic   EYES: PERRL; EOM intact; conjunctiva and sclera are clear bilaterally.  ENT: No rhinorrhea; normal pharynx with no tonsillar hypertrophy; mucous membranes pink/moist, no erythema, no exudate.  NECK: Supple; non-tender; no cervical lymphadenopathy  CARD: Normal S1, S2; no murmurs, rubs, or gallops. Regular rate and rhythm.  RESP: Normal respiratory effort; breath sounds clear and equal bilaterally; no wheezes, rhonchi, or rales.  ABD: Normal bowel sounds; non-distended; non-tender; no palpable organomegaly, no masses, no bruits.  Back Exam: Normal inspection; no vertebral point tenderness, no CVA tenderness. Normal range of motion.  EXT: Normal ROM in all four extremities; non-tender to palpation; no swelling or deformity; distal pulses are normal, no edema.  SKIN: Warm; dry; no rash.  NEURO:Alert and oriented x 3, coherent, NII-XII grossly intact, sensory and motor are non-focal.      MDM  Number of Diagnoses or Management Options  Diagnosis management comments: Assessment: 48 year old male with intermittent episode of chest pain rule  out ACS/ VTE      Plan: labs/ EKG/ chest x-ray/ IV fluid/ ASA/ serial exam/ Monitor and Reevaluate.         Amount and/or Complexity of Data Reviewed  Clinical lab tests: ordered and reviewed  Tests in the radiology section of CPT??: ordered and reviewed  Tests in the medicine section of CPT??: reviewed and ordered  Discussion of test results with the performing providers: yes  Decide to obtain previous medical records or to obtain history from someone other than the patient: yes  Obtain history from someone other than the patient: yes  Review and summarize past medical records: yes  Discuss the patient with other providers: yes  Independent visualization of images, tracings, or specimens: yes    Risk of Complications, Morbidity, and/or Mortality  Presenting problems: moderate  Diagnostic procedures: moderate  Management options: moderate        Procedures    ED EKG interpretation:   Rhythm: sinus bradycardia; and regular . Rate (approx.): 59; Axis: normal; P wave: normal; QRS interval: prolonged; ST/T wave: non-specific changes; in  Lead: Diffusely; right bundle branch block and intraventricular conduction delay; Other findings: abnormal ekg. This EKG was interpreted by Kathrene AluWilliam N Rosealyn Little, MD,ED Provider.    XRAY INTERPRETATION (ED MD)  Chest Xray  No acute process seen. Normal heart size. No bony abnormalities. No infiltrate.  Kathrene AluWilliam N Tarik Teixeira, MD 4:23 AM    Progress Note:   Pt has been reexamined by Kathrene AluWilliam N Ashleigh Luckow, MD. Pt is feeling much better. Symptoms have improved. All available results have been reviewed with pt and any available family. The patient had 2 sets of negative cardiac enzymes and EKGs. Pt understands sx, dx, and tx in ED. Care plan has been outlined and questions have been answered. Pt is ready to go home. Will send home on . Outpatient referral with PCP as needed. Written by Kathrene AluWilliam N Aerie Donica, MD,6:22 AM    .   .

## 2014-02-15 NOTE — ED Notes (Signed)
MD reviewed discharge instructions with patient and patient verbalized understanding.  Pt ambulated to exit without difficulty.

## 2014-07-13 ENCOUNTER — Inpatient Hospital Stay
Admit: 2014-07-13 | Discharge: 2014-07-14 | Disposition: A | Discharge status: Home / Self Care | Payer: MEDICARE | Attending: Emergency Medicine

## 2014-07-13 ENCOUNTER — Emergency Department: Admit: 2014-07-13 | Payer: MEDICARE | Primary: Family Medicine

## 2014-07-13 DIAGNOSIS — R079 Chest pain, unspecified: Secondary | ICD-10-CM

## 2014-07-13 LAB — CBC WITH AUTOMATED DIFF
ABS. BASOPHILS: 0 10*3/uL (ref 0.0–0.06)
ABS. EOSINOPHILS: 0.2 10*3/uL (ref 0.0–0.4)
ABS. LYMPHOCYTES: 0.6 10*3/uL — ABNORMAL LOW (ref 0.9–3.6)
ABS. MONOCYTES: 0.7 10*3/uL (ref 0.05–1.2)
ABS. NEUTROPHILS: 5.2 10*3/uL (ref 1.8–8.0)
BASOPHILS: 0 % (ref 0–2)
EOSINOPHILS: 3 % (ref 0–5)
HCT: 35 % — ABNORMAL LOW (ref 36.0–48.0)
HGB: 11 g/dL — ABNORMAL LOW (ref 13.0–16.0)
LYMPHOCYTES: 9 % — ABNORMAL LOW (ref 21–52)
MCH: 26.3 PG (ref 24.0–34.0)
MCHC: 31.4 g/dL (ref 31.0–37.0)
MCV: 83.5 FL (ref 74.0–97.0)
MONOCYTES: 11 % — ABNORMAL HIGH (ref 3–10)
MPV: 9.2 FL (ref 9.2–11.8)
NEUTROPHILS: 77 % — ABNORMAL HIGH (ref 40–73)
PLATELET: 234 10*3/uL (ref 135–420)
RBC: 4.19 M/uL — ABNORMAL LOW (ref 4.70–5.50)
RDW: 15.4 % — ABNORMAL HIGH (ref 11.6–14.5)
WBC: 6.7 10*3/uL (ref 4.6–13.2)

## 2014-07-13 LAB — METABOLIC PANEL, COMPREHENSIVE
A-G Ratio: 1 (ref 0.8–1.7)
ALT (SGPT): 18 U/L (ref 16–61)
AST (SGOT): 12 U/L — ABNORMAL LOW (ref 15–37)
Albumin: 4.1 g/dL (ref 3.4–5.0)
Alk. phosphatase: 64 U/L (ref 45–117)
Anion gap: 7 mmol/L (ref 3.0–18)
BUN/Creatinine ratio: 11 — ABNORMAL LOW (ref 12–20)
BUN: 14 MG/DL (ref 7.0–18)
Bilirubin, total: 0.6 MG/DL (ref 0.2–1.0)
CO2: 27 mmol/L (ref 21–32)
Calcium: 8.6 MG/DL (ref 8.5–10.1)
Chloride: 104 mmol/L (ref 100–108)
Creatinine: 1.31 MG/DL — ABNORMAL HIGH (ref 0.6–1.3)
GFR est AA: >60 mL/min/{1.73_m2} (ref >60)
GFR est non-AA: 58 mL/min/{1.73_m2} — ABNORMAL LOW (ref >60)
Globulin: 4.1 g/dL — ABNORMAL HIGH (ref 2.0–4.0)
Glucose: 107 mg/dL — ABNORMAL HIGH (ref 74–99)
Potassium: 3.7 mmol/L (ref 3.5–5.5)
Protein, total: 8.2 g/dL (ref 6.4–8.2)
Sodium: 138 mmol/L (ref 136–145)

## 2014-07-13 MED ORDER — ONDANSETRON (PF) 4 MG/2 ML INJECTION
4 mg/2 mL | INTRAMUSCULAR | Status: AC
Start: 2014-07-13 — End: 2014-07-13
  Administered 2014-07-13: via INTRAVENOUS

## 2014-07-13 MED ORDER — MORPHINE 4 MG/ML SYRINGE
4 mg/mL | INTRAMUSCULAR | Status: AC
Start: 2014-07-13 — End: 2014-07-13
  Administered 2014-07-13: via INTRAVENOUS

## 2014-07-13 MED FILL — MORPHINE 4 MG/ML SYRINGE: 4 mg/mL | INTRAMUSCULAR | Qty: 1

## 2014-07-13 MED FILL — ONDANSETRON (PF) 4 MG/2 ML INJECTION: 4 mg/2 mL | INTRAMUSCULAR | Qty: 2

## 2014-07-13 NOTE — ED Notes (Signed)
I have reviewed discharge instructions with the patient.  The patient verbalized understanding.  Patient armband removed and shredded.  Pt d/cd to home awake, alert and in NAD.  VSS.

## 2014-07-13 NOTE — ED Notes (Signed)
Xray at bedside for chest portable.

## 2014-07-13 NOTE — ED Notes (Signed)
PIV started and labs drawn and given to lab for processing. Patient placed on cardiac monitor and continuous pulse ox. Oxygen 2 L via NC implemented. Patient reports that his pain is starting to increase. He relates two previous MIs in the past. He states his pain began around 1715, and he took 3 nitros and 4-81 mg aspirin. Patient given warmed blanket and waiting for physician evaluation.

## 2014-07-13 NOTE — ED Provider Notes (Addendum)
HPI Comments: 49 yo M with h/o MI and HTN c/o CP which started at 30. Pain is located substernal, radiates to left arm. Pain is intermittent, improved since onset. Admits to nausea, denies fever, SOB, vomiting, abdominal pain, leg swelling. Has remote h/o MI in early 90s, had been followed by cardiology but recently moved from [facility] and does not have any doctors here in [place] yet. Took 325 mg ASA this morning. No other complaints.     Patient is a 49 y.o. male presenting with chest pain.   Chest Pain (Angina)          Past Medical History:   Diagnosis Date   ??? MI (myocardial infarction) (HCC)    ??? Hypertension    ??? Hyperlipemia    ??? Mood disorder (HCC) 01/12/2014       History reviewed. No pertinent past surgical history.      History reviewed. No pertinent family history.    History     Social History   ??? Marital Status: SINGLE     Spouse Name: N/A     Number of Children: N/A   ??? Years of Education: N/A     Occupational History   ??? Not on file.     Social History Main Topics   ??? Smoking status: Never Smoker    ??? Smokeless tobacco: Not on file   ??? Alcohol Use: No   ??? Drug Use: No   ??? Sexual Activity: Not on file     Other Topics Concern   ??? Not on file     Social History Narrative                ALLERGIES: Pcn      Review of Systems   Cardiovascular: Positive for chest pain.   All other systems reviewed and are negative.      Filed Vitals:    07/13/14 1747 07/13/14 1805   BP: 127/91    Pulse: 104    Temp: 98 ??F (36.7 ??C)    Resp: 16    Height: 5\' 11"  (1.803 m)    Weight: 72.576 kg (160 lb)    SpO2: 100% 100%            Physical Exam   Constitutional: He is oriented to person, place, and time. He appears well-developed and well-nourished. No distress.   HENT:   Head: Normocephalic and atraumatic.   Cardiovascular: Normal rate, regular rhythm and normal heart sounds.    Pulmonary/Chest: Effort normal and breath sounds normal. No respiratory distress. He has no wheezes. He has no rales.    Abdominal: Soft. He exhibits no distension. There is no tenderness. There is no rebound and no guarding.   Musculoskeletal: Normal range of motion.   No peripheral edema noted   Neurological: He is alert and oriented to person, place, and time.   Skin: Skin is warm and dry.   Psychiatric: He has a normal mood and affect. His behavior is normal. Judgment and thought content normal.   Nursing note and vitals reviewed.       MDM  Number of Diagnoses or Management Options  Acute chest pain: new and requires workup  Diagnosis management comments: DIFFERENTIAL DIAGNOSES/ MEDICAL DECISION MAKING:  Chest pain etiologies include acute cardiac events to include possible acute myocardial infarction, acute coronary syndrome, pneumonia, chest wall pain (myofascial/ musculoskeletal etiology), chronic obstructive pulmonary disease (copd), acute asthma exacerbation, congestive heart failure, acute bronchitis, pulmonary embolism, upper respiratory infection, referred  abdominal pain, other etiologies, versus combination of the above.    Pt well appearing and in NAD. Resting comfortably at this time. Awaiting lab results. 7:05 PM   Labs Reviewed  CBC WITH AUTOMATED DIFF - Abnormal; Notable for the following:      RBC                           4.19 (*)               HGB                           11.0 (*)               HCT                           35.0 (*)               RDW                           15.4 (*)               NEUTROPHILS                   77 (*)                 LYMPHOCYTES                   9 (*)                  MONOCYTES                     11 (*)                 ABS. LYMPHOCYTES              0.6 (*)             All other components within normal limits  METABOLIC PANEL, COMPREHENSIVE - Abnormal; Notable for the following:      Glucose                       107 (*)                Creatinine                    1.31 (*)               BUN/Creatinine ratio          11 (*)                  GFR est non-AA                58 (*)                 AST                           12 (*)                 Globulin                      4.1 (*)  All other components within normal limits  CARDIAC PANEL,(CK, CKMB & TROPONIN) - Abnormal; Notable for the following:      CK - MB                       <0.5 (*)            All other components within normal limits  TROPONIN I  CK W/ REFLX CKMB    Labs unremarkable. CXR unremarkable. EKG shows RBBB, unchanged from previous. D/w Dr. Garnett Farm who agrees pt may be d/h to f/u for outpatient stress test. 10:58 PM        Amount and/or Complexity of Data Reviewed  Clinical lab tests: ordered and reviewed  Tests in the radiology section of CPT??: ordered and reviewed  Discuss the patient with other providers: yes (argila )  Independent visualization of images, tracings, or specimens: yes        Procedures                           -------------------------------------------------------------------------------------------------------------------     EKG INTERPRETATIONS:      RADIOLOGY RESULTS:   XR CHEST PORT   Preliminary Result          LABORATORY RESULTS:  Recent Results (from the past 12 hour(s))   EKG, 12 LEAD, INITIAL    Collection Time: 07/13/14  5:37 PM   Result Value Ref Range    Ventricular Rate 100 BPM    Atrial Rate 100 BPM    P-R Interval 124 ms    QRS Duration 120 ms    Q-T Interval 362 ms    QTC Calculation (Bezet) 466 ms    Calculated P Axis 68 degrees    Calculated R Axis 77 degrees    Calculated T Axis 41 degrees    Diagnosis       Normal sinus rhythm  Right bundle branch block  Abnormal ECG  No previous ECGs available     CBC WITH AUTOMATED DIFF    Collection Time: 07/13/14  6:00 PM   Result Value Ref Range    WBC 6.7 4.6 - 13.2 K/uL    RBC 4.19 (L) 4.70 - 5.50 M/uL    HGB 11.0 (L) 13.0 - 16.0 g/dL    HCT 35.0 (L) 36.0 - 48.0 %    MCV 83.5 74.0 - 97.0 FL    MCH 26.3 24.0 - 34.0 PG    MCHC 31.4 31.0 - 37.0 g/dL    RDW 15.4 (H) 11.6 - 14.5 %     PLATELET 234 135 - 420 K/uL    MPV 9.2 9.2 - 11.8 FL    NEUTROPHILS 77 (H) 40 - 73 %    LYMPHOCYTES 9 (L) 21 - 52 %    MONOCYTES 11 (H) 3 - 10 %    EOSINOPHILS 3 0 - 5 %    BASOPHILS 0 0 - 2 %    ABS. NEUTROPHILS 5.2 1.8 - 8.0 K/UL    ABS. LYMPHOCYTES 0.6 (L) 0.9 - 3.6 K/UL    ABS. MONOCYTES 0.7 0.05 - 1.2 K/UL    ABS. EOSINOPHILS 0.2 0.0 - 0.4 K/UL    ABS. BASOPHILS 0.0 0.0 - 0.06 K/UL    DF AUTOMATED     METABOLIC PANEL, COMPREHENSIVE    Collection Time: 07/13/14  6:00 PM   Result Value Ref Range    Sodium 138 136 - 145 mmol/L  Potassium 3.7 3.5 - 5.5 mmol/L    Chloride 104 100 - 108 mmol/L    CO2 27 21 - 32 mmol/L    Anion gap 7 3.0 - 18 mmol/L    Glucose 107 (H) 74 - 99 mg/dL    BUN 14 7.0 - 18 MG/DL    Creatinine 1.31 (H) 0.6 - 1.3 MG/DL    BUN/Creatinine ratio 11 (L) 12 - 20      GFR est AA >60 >60 ml/min/1.12m2    GFR est non-AA 58 (L) >60 ml/min/1.62m2    Calcium 8.6 8.5 - 10.1 MG/DL    Bilirubin, total 0.6 0.2 - 1.0 MG/DL    ALT 18 16 - 61 U/L    AST 12 (L) 15 - 37 U/L    Alk. phosphatase 64 45 - 117 U/L    Protein, total 8.2 6.4 - 8.2 g/dL    Albumin 4.1 3.4 - 5.0 g/dL    Globulin 4.1 (H) 2.0 - 4.0 g/dL    A-G Ratio 1.0 0.8 - 1.7     TROPONIN I    Collection Time: 07/13/14  6:00 PM   Result Value Ref Range    Troponin-I, Qt. 0.03 0.0 - 0.045 NG/ML   CARDIAC PANEL,(CK, CKMB & TROPONIN)    Collection Time: 07/13/14 10:00 PM   Result Value Ref Range    CK 82 39 - 308 U/L    CK - MB <0.5 (L) 0.5 - 3.6 ng/ml    CK-MB Index CANNOT BE CALCULATED 0.0 - 4.0 %    Troponin-I, Qt. 0.04 0.0 - 0.045 NG/ML           CONSULTATIONS:        PROGRESS NOTES:        Lengthy D/W pt regarding possible worsening of pt's condition, need for follow up and strict ED return instructions for any worsening symptoms.     DISPOSITION:  ED DIAGNOSIS & DISPOSITION INFORMATION  Diagnosis:   1. Acute chest pain          Disposition: home    Follow-up Information     Follow up With Details Comments Contact Info     Jonah R Flowers, DO Schedule an appointment as soon as possible for a visit For further evaluation [address]  [place]  [place] [address]  [phone]      Brita Romp, MD Schedule an appointment as soon as possible for a visit For further evaluation [place]  [address]  [facility]  [place] [address]  [phone]      [facility] EMERGENCY DEPT  Immediately if symptoms worsen [place]  [phone]          Patient's Medications   Start Taking    No medications on file   Continue Taking    ASPIRIN 81 MG CHEWABLE TABLET    Take 81 mg by mouth daily.    ATORVASTATIN (LIPITOR) 40 MG TABLET    Take 40 mg by mouth daily.    CLOPIDOGREL (PLAVIX) 75 MG TABLET    Take  by mouth daily.    METOPROLOL (LOPRESSOR) 25 MG TABLET    Take 1 Tab by mouth daily. Indications: HYPERTENSION   These Medications have changed    No medications on file   Stop Taking    No medications on file

## 2014-07-13 NOTE — ED Notes (Signed)
I performed a brief evaluation, including history and physical, of the patient here in triage and I have determined that pt will need further treatment and evaluation from the main side ER physician.  I have placed initial orders to help in expediting patients care.     July 13, 2014 at 5:48 PM - Kirke ShaggyMICHAEL L Eydie Wormley, MD        There were no vitals taken for this visit.

## 2014-07-13 NOTE — ED Notes (Signed)
Assumed care of pt.  Pt currently denying chest pain.

## 2014-07-13 NOTE — ED Notes (Signed)
30 min ago chest pain radiating to left arm and neck. Hx MI w/ stents

## 2014-07-13 NOTE — ED Notes (Signed)
Bedside and Verbal shift change report given to Sharrie Rothmanasey Rn  (oncoming nurse) by Marlowe AschoffAHMIE  Dyanne Yorks, RN (offgoing nurse). Report included the following information SBAR.

## 2014-07-14 ENCOUNTER — Encounter

## 2014-07-14 ENCOUNTER — Inpatient Hospital Stay
Admit: 2014-07-14 | Discharge: 2014-07-14 | Disposition: A | Discharge status: Home / Self Care | Payer: MEDICARE | Attending: Emergency Medicine

## 2014-07-14 ENCOUNTER — Emergency Department: Payer: MEDICARE | Primary: Family Medicine

## 2014-07-14 DIAGNOSIS — R0789 Other chest pain: Secondary | ICD-10-CM

## 2014-07-14 LAB — EKG, 12 LEAD, INITIAL
Atrial Rate: 100 {beats}/min
Atrial Rate: 92 {beats}/min
Calculated P Axis: 68 degrees
Calculated P Axis: 67 degrees
Calculated R Axis: 77 degrees
Calculated R Axis: 73 degrees
Calculated T Axis: 41 degrees
Calculated T Axis: 38 degrees
Diagnosis: NORMAL
Diagnosis: NORMAL
P-R Interval: 124 ms
P-R Interval: 134 ms
Q-T Interval: 362 ms
Q-T Interval: 388 ms
QRS Duration: 120 ms
QRS Duration: 124 ms
QTC Calculation (Bezet): 466 ms
QTC Calculation (Bezet): 479 ms
Ventricular Rate: 100 {beats}/min
Ventricular Rate: 92 {beats}/min

## 2014-07-14 LAB — CARDIAC PANEL,(CK, CKMB & TROPONIN)
CK - MB: <0.5 ng/ml — ABNORMAL LOW (ref 0.5–3.6)
CK: 82 U/L (ref 39–308)
Troponin-I, QT: 0.04 NG/ML (ref 0.0–0.045)

## 2014-07-14 LAB — POC TROPONIN-I: Troponin-I (POC): <0.04 ng/mL (ref 0.00–0.08)

## 2014-07-14 LAB — TROPONIN I: Troponin-I, QT: 0.03 NG/ML (ref 0.0–0.045)

## 2014-07-14 MED ORDER — MORPHINE 2 MG/ML INJECTION
2 mg/mL | INTRAMUSCULAR | Status: AC
Start: 2014-07-14 — End: 2014-07-14
  Administered 2014-07-14: 16:00:00 via INTRAVENOUS

## 2014-07-14 MED ORDER — MORPHINE 4 MG/ML SYRINGE
4 mg/mL | INTRAMUSCULAR | Status: AC
Start: 2014-07-14 — End: 2014-07-13
  Administered 2014-07-14: 03:00:00 via INTRAVENOUS

## 2014-07-14 MED ORDER — ONDANSETRON (PF) 4 MG/2 ML INJECTION
4 mg/2 mL | INTRAMUSCULAR | Status: AC
Start: 2014-07-14 — End: 2014-07-14
  Administered 2014-07-14: 16:00:00 via INTRAVENOUS

## 2014-07-14 MED FILL — MORPHINE 4 MG/ML SYRINGE: 4 mg/mL | INTRAMUSCULAR | Qty: 1

## 2014-07-14 MED FILL — MORPHINE 2 MG/ML INJECTION: 2 mg/mL | INTRAMUSCULAR | Qty: 1

## 2014-07-14 MED FILL — ONDANSETRON (PF) 4 MG/2 ML INJECTION: 4 mg/2 mL | INTRAMUSCULAR | Qty: 2

## 2014-07-14 NOTE — ED Notes (Signed)
I have reviewed discharge instructions with the patient.  The patient verbalized understanding.  Patient armband removed and shredded, no scripts given

## 2014-07-14 NOTE — ED Provider Notes (Signed)
HPI Comments: Dennis Barrett is a 49 year old male who presents to the ED today with a c/o chest pain.  Pt states he took three sulingual nitro tabs and four baby aspirins prior to arrival and his pain went from a 8 to a 5.    Pt states he has had two prior heart attacks in the past.  The first in 1991 and the second in 1995.  He has had right sided stents placed at those times.   Pt was also here last evening, had two sets of troponin's drawn, and was discharged to home with a referral to cardiology.  The Pt went to cardiology, and then to a family practice office.  He told them he wanted an appointment,mt for CP and they told him to come to the ER for evaluation.  Pt states he has an appointment with Dr Claiborne Billingsallahan on 07-17-14 at 1500.      Patient is a 49 y.o. male presenting with chest pain. The history is provided by the patient. History limited by: no communication barrier.   Chest Pain (Angina)   This is a new problem. The current episode started 12 to 24 hours ago. The problem has not changed since onset.The problem occurs constantly. The pain is associated with normal activity. The pain is present in the substernal region. The pain is moderate. The quality of the pain is described as pressure-like. The pain does not radiate.        Past Medical History:   Diagnosis Date   ??? MI (myocardial infarction) (HCC)    ??? Hypertension    ??? Hyperlipemia    ??? Mood disorder (HCC) 01/12/2014       No past surgical history on file.      No family history on file.    History     Social History   ??? Marital Status: SINGLE     Spouse Name: N/A     Number of Children: N/A   ??? Years of Education: N/A     Occupational History   ??? Not on file.     Social History Main Topics   ??? Smoking status: Never Smoker    ??? Smokeless tobacco: Not on file   ??? Alcohol Use: No   ??? Drug Use: No   ??? Sexual Activity: Not on file     Other Topics Concern   ??? Not on file     Social History Narrative                ALLERGIES: Pcn       Review of Systems   Constitutional: Negative.    HENT: Negative.    Eyes: Negative.    Respiratory: Negative.    Cardiovascular: Positive for chest pain.   Gastrointestinal: Negative.    Endocrine: Negative.    Genitourinary: Negative.    Musculoskeletal: Negative.    Skin: Negative.    Allergic/Immunologic: Negative.    Neurological: Negative.    Hematological: Negative.    Psychiatric/Behavioral: Negative.        Filed Vitals:    07/14/14 1018 07/14/14 1030 07/14/14 1045 07/14/14 1100   BP: 133/77 137/80 129/74 130/73   Pulse: 97  89 86   Temp: 98.2 ??F (36.8 ??C)      Resp: 18  15 15    Height: 5\' 6"  (1.676 m)      Weight: 72.576 kg (160 lb)      SpO2: 100% 99% 99% 100%  Physical Exam   Constitutional: He is oriented to person, place, and time. He appears well-developed and well-nourished. No distress.   HENT:   Head: Normocephalic and atraumatic.   Eyes: Pupils are equal, round, and reactive to light.   Neck: Normal range of motion. Neck supple.   Cardiovascular: Normal rate, regular rhythm, normal heart sounds and intact distal pulses.    Pulmonary/Chest: Effort normal and breath sounds normal. No respiratory distress. He has no wheezes. He has no rales.   Palpation to the anterior chest wall does not exacerbate the Pt's pain.     Abdominal: Soft. Bowel sounds are normal. There is no tenderness. There is no rebound and no guarding.   Genitourinary:   NE   Musculoskeletal: Normal range of motion. He exhibits no edema or tenderness.   Neurological: He is alert and oriented to person, place, and time. No cranial nerve deficit. Coordination normal.   Skin: Skin is warm and dry.   Psychiatric: He has a normal mood and affect.   Nursing note and vitals reviewed.       MDM  Number of Diagnoses or Management Options  Non-cardiac chest pain:      Amount and/or Complexity of Data Reviewed  Clinical lab tests: ordered and reviewed        Procedures    Diagnosis:   1. Non-cardiac chest pain           Disposition:   Discharged to Home.      Follow-up Information     Follow up With Details Comments Contact Info    Aggie Cosier, MD Go to your appointment on Monday 07-17-14 at 3:00 PM as scheduled.   [address]  [address]  [facility]  [place] [address]  [phone]            Current Discharge Medication List      CONTINUE these medications which have NOT CHANGED    Details   metoprolol (LOPRESSOR) 25 mg tablet Take 1 Tab by mouth daily. Indications: HYPERTENSION  Qty: 30 Tab, Refills: 0      atorvastatin (LIPITOR) 40 mg tablet Take 40 mg by mouth daily.      clopidogrel (PLAVIX) 75 mg tablet Take  by mouth daily.      aspirin 81 mg chewable tablet Take 81 mg by mouth daily.

## 2014-07-14 NOTE — ED Notes (Signed)
Patient states he took 4 baby aspirin and 3 nitro with some pain relief, patient states this pain is similar to the pain he had with a previous MI, was in the ED last night for similar symptoms, was not able to see cardiologist this am

## 2014-07-14 NOTE — ED Notes (Signed)
Lights off, rails up, given a warm blanket.

## 2014-07-17 ENCOUNTER — Encounter: Attending: Internal Medicine | Primary: Family Medicine

## 2014-07-17 ENCOUNTER — Encounter: Attending: Cardiovascular Disease | Primary: Family Medicine

## 2014-07-17 NOTE — Telephone Encounter (Signed)
Unable to reach pt to r/s no show

## 2014-07-20 ENCOUNTER — Emergency Department: Admit: 2014-07-21 | Payer: MEDICARE | Primary: Family Medicine

## 2014-07-20 DIAGNOSIS — I2510 Atherosclerotic heart disease of native coronary artery without angina pectoris: Secondary | ICD-10-CM

## 2014-07-20 LAB — POC TROPONIN: Troponin-I: 0 ng/ml (ref 0.00–0.07)

## 2014-07-20 LAB — POC CHEM8
BUN: 12 mg/dl (ref 7–25)
CALCIUM,IONIZED: 5 mg/dL (ref 4.40–5.40)
CO2, TOTAL: 23 mmol/L (ref 21–32)
Chloride: 101 mEq/L (ref 98–107)
Creatinine: 1.1 mg/dl (ref 0.6–1.3)
Glucose: 84 mg/dL (ref 74–106)
HCT: 37 % — ABNORMAL LOW (ref 40–54)
HGB: 12.6 gm/dl (ref 12.4–17.2)
Potassium: 3.8 mEq/L (ref 3.5–4.9)
Sodium: 139 mEq/L (ref 136–145)

## 2014-07-20 LAB — CBC WITH AUTOMATED DIFF
BASOPHILS: 0.8 % (ref 0–3)
EOSINOPHILS: 3.4 % (ref 0–5)
HCT: 35.3 % — ABNORMAL LOW (ref 37.0–50.0)
HGB: 10.9 gm/dl — ABNORMAL LOW (ref 12.4–17.2)
IMMATURE GRANULOCYTES: 0.2 % (ref 0.0–3.0)
LYMPHOCYTES: 18.1 % — ABNORMAL LOW (ref 28–48)
MCH: 26.2 pg (ref 23.0–34.6)
MCHC: 30.9 gm/dl (ref 30.0–36.0)
MCV: 84.9 fL (ref 80.0–98.0)
MONOCYTES: 12.2 % (ref 1–13)
MPV: 9.3 fL (ref 6.0–10.0)
NEUTROPHILS: 65.3 % — ABNORMAL HIGH (ref 34–64)
NRBC: 0 (ref 0–0)
PLATELET: 294 10*3/uL (ref 140–450)
RBC: 4.16 M/uL (ref 3.80–5.70)
RDW-SD: 48.9 — ABNORMAL HIGH (ref 35.1–43.9)
WBC: 5.3 10*3/uL (ref 4.0–11.0)

## 2014-07-20 LAB — TROPONIN I
Troponin-I: <0.015 ng/ml (ref 0.00–0.09)
Troponin-I: <0.015 ng/ml (ref 0.00–0.09)

## 2014-07-20 NOTE — ED Notes (Signed)
Pt aox3 at around 1930 he began to have left chest pain with radiation to left neck and left arm. Pt took 4-81mg  ASA and 3 SL nitro at home with some relief. Pt states significant cardiac hx.

## 2014-07-20 NOTE — Discharge Summary (Addendum)
[facility]  ED Discharge Summary  NAME:  Dennis Barrett, Dennis Barrett  SEX:   M  DOB: [birth year]  MR#    [id number]  ROOM:  [id number]  ACCT#  [id number]        [facility]:  [facility] at 11:51 a.m.    DATE AND TIME DISCHARGE FROM OBSERVATION:  07/20/2014 at 1900    REASON PLACED IN OBSERVATION:    The patient was seen in the Emergency Department for evaluation of chest pain.  The evaluation was unremarkable and subsequently the patient was assigned to  observation under chest pain protocol.   The patient had history of MI with  cardiac stenting in the past.    COURSE IN OBSERVATION:    The patient did have pain, was medicated again with morphine as he stated that  nitroglycerin was minimally effective and morphine was much more able to  control his pain.  Serial troponins were negative.  A consultation from [facility],  Dr. Constance HawMcCray, and physician assistant obtained additional records that patient  actually had a negative cardiac catheterization 07/12/13 by Dr. 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  Even if finding in right does represent small dissection, does not  appear to be enough stenosis to be causing rest angina and suspected  incidental finding.\\  Per their recommendations, he was not noticed at that  time to require additional intervention or evaluation, and it was noted in  other records he apparently had a history of malingering behavior per their  review of the records.  From [facility], it is felt the patient should had trended  troponins and monitoring continue secondary to prevention and maintain his  [facility].  I discussed this with the  patient.  He was somewhat frustrated with us discharging him, but I stated  that had we been aware of his recent cardiac catheterization as opposed to   what he had apparently told Dr. Excell Seltzerooper was no cardiac testing since 1990, that  we would not have told him he would be staying overnight.  He continued to  decline any tenderness to his chest with movement or palpation.  This is not  felt to be musculoskeletal, and so we did not start the patient on any  analgesic medications separate from when he was already taking, subsequently  discharged.    FINAL DIAGNOSIS:  Acute chest pain.    DISPOSITION AND TREATMENT PLAN:  Discharge instructions given.  The patient is discharged home in stable  condition, with instructions to follow up with their regular doctor.  They are  advised to return immediately for any worsening or symptoms of concern.  Patient discharged by Dr. Mabeline CarasMaxwell Mariapaula Krist.      ___________________  Enzo MontgomeryMaxwell I Omni Dunsworth MD  Dictated EA:[id number]:Bryan P. Lockie ParesHendrick, PA-C    My signature above authenticates this document and my orders, the final  diagnosis(es), discharge prescription(s) and instructions in the [facility] record.  If you have any questions please contact [phone].  Nursing notes have been reviewed by the physician/mid-level provider.    If you have any questions please contact [phone].    LH  D:07/20/2014 18:25:22  T: 07/20/2014 21:27:04  [id number]  Electronically Authenticated by:  Marge Vandermeulen I. Gavyn Zoss On 07/27/2014 09:12 AM EST

## 2014-07-20 NOTE — ED Provider Notes (Addendum)
[facility]  EMERGENCY DEPARTMENT TREATMENT REPORT  NAME:  Marsteller, NELSON  SEX:   M  ADMIT: 07/20/2014  DOB:   [birth year]  MR#    [id number]  ROOM:  [id number]  TIME DICTATED: 01 08 PM  ACCT#  [id number]        I hereby certify this patient for admission based upon medical necessity as  noted below:     PRIMARY CARE PHYSICIAN:  Out of town.    CHIEF COMPLAINT:  Chest pain.    HISTORY OF PRESENT ILLNESS:  This is a pleasant 49 year old male with a history of coronary artery disease  presenting with complaints of chest pain.  He states it began around 8:30 this  morning as he was sitting on the tailgate of his truck.  He describes a 10 out  of 10 heaviness and pressure to the left side of his chest that radiated up  into the left side of his neck and into the left arm.  He is also experiencing  tingling sensation.  This lasted approximately 10 to 15 minutes.  He reports  associated diaphoresis, nausea, vomiting.  He has thrown up twice.  Denies any  associated abdominal pain.  He feels somewhat short of breath but denies  pleuritic pain.  He took 4 baby aspirin and 3 sublingual nitroglycerin and his  pain is gradually improving, now rated a 7 out of 10.  He denies any  additional alleviating or aggravating factors.  Reported experiencing similar  pain in the past related to an MI.    REVIEW OF SYSTEMS:  CONSTITUTIONAL:  No fevers or chills.  EYES:  No visual symptoms.  ENT:  No sore throat, runny nose, or other URI symptoms.  RESPIRATORY:  Dyspnea.  No pleuritic pain.  CARDIOVASCULAR:  Chest pain.  GASTROINTESTINAL:  Nausea, vomiting.  No abdominal pain or diarrhea.  GENITOURINARY:  No dysuria, frequency, or urgency.   MUSCULOSKELETAL:  Left arm pain.  INTEGUMENTARY:  No rash.  NEUROLOGICAL:  No headache.    PAST MEDICAL HISTORY:  Coronary artery disease status post stenting, hypertension.    SOCIAL HISTORY:  Does not smoke but uses chewing tobacco.    MEDICATIONS:  Reviewed in [facility].    ALLERGIES:   REVIEWED IN IBEX.    PHYSICAL EXAMINATION:  VITAL SIGNS:  Blood pressure 124/64, pulse 95, respirations 16, temperature  98.0, O2 saturation 100% on room air.  GENERAL APPEARANCE:  Patient appears well developed and well nourished.  Appearance and behavior are age and situation appropriate.  He is lying  comfortably on stretcher, nontoxic appearing.  HEENT:  Head NC/AT.  Eyes:  Conjunctivae are clear, no injection.  Mouth/throat:  Oral mucosa is pink, somewhat dry.  He does have chewing  tobacco present.  NECK:  Supple, symmetrical, no JVD.    LYMPHATICS:  No cervical or submandibular lymphadenopathy palpated.   RESPIRATORY:  Lungs are clear to auscultation bilaterally, no wheezing.  CARDIOVASCULAR:  Regular rate and rhythm, no murmurs, no peripheral edema.  Calves are soft, nontender.  GASTROINTESTINAL:  Abdomen is soft and nontender.  MUSCULOSKELETAL:  Stance and gait appear normal.  SKIN:  Warm and dry without rashes.  NEUROLOGIC:  The patient is alert and oriented, answers questions  appropriately.  Motor strength is 5/5 to bilateral upper and lower   extremities.    CONTINUATION BY Mabeline Caras, MD    The patient is a 49 year old male.  The patient presented today with chief  complaint of chest  pain.  Seen by my PA and I agree with her documentation.    INITIAL ASSESSMENT AND MANAGEMENT PLAN:  Differential diagnosis for chest pain of this description is concerning for  angina versus acute myocardial infarction.  Plan will be to obtain a troponin,  chest x-ray, EKG, basic lab testing.  If troponins and EKG show acute  ischemia, we will call Cardiology for intervention.  Given the patient's high  risk, given previous stenting and acute coronary event, his testing is  negative, and given that his last stress test was over 10 years ago, we will  admit to observation for serial troponins and cardiac consult.    DIAGNOSTIC RESULTS:  Troponin undetectable low at 0.  Creatinine 1.1, potassium 3.8, sodium 139.   CBC:  WBC 5.3, hemoglobin 10.9.  Chest x-ray:  Normal sized heart, lungs are  clear and well expanded.  Normal chest x-ray.  The patient's pain is improved  on morphine, nitroglycerin.  He took 4 baby aspirin prior to arrival, 2  sublingual nitroglycerins prior to arrival.  His pain improved with sublingual  and percutaneous nitroglycerin, as well as 4 of morphine.    CLINICAL IMPRESSION:  Chest pain concerning for anginal equivalent.    FINAL DIAGNOSIS:  Chest pain.      DISPOSITION:   Admit to observation unit with cardiac consultation.    CONDITION:  Stable.      ___________________  Enzo MontgomeryMaxwell I Martese Vanatta MD  Dictated By: Zachary GeorgeMichelle M. Pernell DupreAdams, PA-C    My signature above authenticates this document and my orders, the final  diagnosis (es), discharge prescription (s), and instructions in the [facility] record.  Nursing notes have been reviewed by the physician/mid-level provider.    If you have any questions please contact [phone].    JMB  D:07/20/2014 13:08:40  T: 07/20/2014 13:17:30  [id number]  Electronically Authenticated by:  Forrest Demuro I. Lazaria Schaben On 07/20/2014 03:38 PM EST

## 2014-07-20 NOTE — ED Notes (Signed)
Pt c/o chest pain x 45 min with n/v and SOB.  Pt denies dizziness and diaphoresis.  Pt is able to speak in complete sentences.

## 2014-07-21 ENCOUNTER — Encounter

## 2014-07-21 ENCOUNTER — Inpatient Hospital Stay
Admit: 2014-07-21 | Discharge: 2014-07-21 | Disposition: A | Discharge status: Home / Self Care | Payer: MEDICARE | Attending: Emergency Medicine

## 2014-07-21 LAB — METABOLIC PANEL, COMPREHENSIVE
A-G Ratio: 1 (ref 0.8–1.7)
ALT (SGPT): 16 U/L (ref 16–61)
AST (SGOT): 10 U/L — ABNORMAL LOW (ref 15–37)
Albumin: 3.9 g/dL (ref 3.4–5.0)
Alk. phosphatase: 66 U/L (ref 45–117)
Anion gap: 8 mmol/L (ref 3.0–18)
BUN/Creatinine ratio: 11 — ABNORMAL LOW (ref 12–20)
BUN: 12 MG/DL (ref 7.0–18)
Bilirubin, total: 0.6 MG/DL (ref 0.2–1.0)
CO2: 27 mmol/L (ref 21–32)
Calcium: 8.4 MG/DL — ABNORMAL LOW (ref 8.5–10.1)
Chloride: 103 mmol/L (ref 100–108)
Creatinine: 1.12 MG/DL (ref 0.6–1.3)
GFR est AA: >60 mL/min/{1.73_m2} (ref >60)
GFR est non-AA: >60 mL/min/{1.73_m2} (ref >60)
Globulin: 3.9 g/dL (ref 2.0–4.0)
Glucose: 107 mg/dL — ABNORMAL HIGH (ref 74–99)
Potassium: 3.6 mmol/L (ref 3.5–5.5)
Protein, total: 7.8 g/dL (ref 6.4–8.2)
Sodium: 138 mmol/L (ref 136–145)

## 2014-07-21 LAB — CARDIAC PANEL,(CK, CKMB & TROPONIN)
CK - MB: 0.6 ng/ml (ref 0.5–3.6)
CK - MB: 0.7 ng/ml (ref 0.5–3.6)
CK-MB Index: 0.4 % (ref 0.0–4.0)
CK-MB Index: 0.5 % (ref 0.0–4.0)
CK: 167 U/L (ref 39–308)
CK: 149 U/L (ref 39–308)
Troponin-I, QT: <0.02 NG/ML (ref 0.0–0.045)
Troponin-I, QT: <0.02 NG/ML (ref 0.0–0.045)

## 2014-07-21 LAB — CBC WITH AUTOMATED DIFF
ABS. BASOPHILS: 0 10*3/uL (ref 0.0–0.1)
ABS. EOSINOPHILS: 0.4 10*3/uL (ref 0.0–0.4)
ABS. LYMPHOCYTES: 1.2 10*3/uL (ref 0.9–3.6)
ABS. MONOCYTES: 0.7 10*3/uL (ref 0.05–1.2)
ABS. NEUTROPHILS: 2.9 10*3/uL (ref 1.8–8.0)
BASOPHILS: 1 % (ref 0–2)
EOSINOPHILS: 7 % — ABNORMAL HIGH (ref 0–5)
HCT: 33.5 % — ABNORMAL LOW (ref 36.0–48.0)
HGB: 10.7 g/dL — ABNORMAL LOW (ref 13.0–16.0)
LYMPHOCYTES: 23 % (ref 21–52)
MCH: 26.5 PG (ref 24.0–34.0)
MCHC: 31.9 g/dL (ref 31.0–37.0)
MCV: 82.9 FL (ref 74.0–97.0)
MONOCYTES: 13 % — ABNORMAL HIGH (ref 3–10)
MPV: 8.9 FL — ABNORMAL LOW (ref 9.2–11.8)
NEUTROPHILS: 56 % (ref 40–73)
PLATELET: 265 10*3/uL (ref 135–420)
RBC: 4.04 M/uL — ABNORMAL LOW (ref 4.70–5.50)
RDW: 15.5 % — ABNORMAL HIGH (ref 11.6–14.5)
WBC: 5.2 10*3/uL (ref 4.6–13.2)

## 2014-07-21 LAB — NT-PRO BNP: NT pro-BNP: 15 PG/ML (ref 0–900)

## 2014-07-21 LAB — MAGNESIUM: Magnesium: 2.1 mg/dL (ref 1.8–2.4)

## 2014-07-21 LAB — D DIMER: D DIMER: 0.42 ug/ml(FEU) (ref <0.46)

## 2014-07-21 LAB — D-DIMER, QUANTITATIVE: D-Dimer, Quant: 0.42 ug/ml(FEU) (ref <0.46)

## 2014-07-21 MED ORDER — TRAMADOL 50 MG TAB
50 mg | ORAL_TABLET | Freq: Four times a day (QID) | ORAL | Status: AC | PRN
Start: 2014-07-21 — End: 2014-07-25

## 2014-07-21 MED ORDER — NITROGLYCERIN 0.4 MG SUBLINGUAL TAB
0.4 mg | SUBLINGUAL | Status: DC | PRN
Start: 2014-07-21 — End: 2014-07-21

## 2014-07-21 MED FILL — NITROSTAT 0.4 MG SUBLINGUAL TABLET: 0.4 mg | SUBLINGUAL | Qty: 1

## 2014-07-21 NOTE — ED Provider Notes (Signed)
HPI Comments: 3:26 AM Dennis Barrett is a 49 y.o. male who presents to ED with chief complaint of chest pain, onset 1930 last night. Pt was sleeping when entering the room and states he is having the CP radiating down the left arm at the time. He states when sx started he took ASA and nitro with some relief. He reports h/o MI at the age of 49 he states at the time he was overweight and was heavily smoking. He reports [facility] when he had the MI and had 3 stents placed in his right coronary artery. [place] here. Pt denies fever or chills, N/V/D, abd pain, SOB, palpations, HA, neck pain or any further acute complaints at the time.       The history is provided by the patient.        Past Medical History:   Diagnosis Date   ??? MI (myocardial infarction) (HCC)    ??? Hypertension    ??? Hyperlipemia    ??? Mood disorder (HCC) 01/12/2014       History reviewed. No pertinent past surgical history.      History reviewed. No pertinent family history.    History     Social History   ??? Marital Status: SINGLE     Spouse Name: N/A     Number of Children: N/A   ??? Years of Education: N/A     Social History Main Topics   ??? Smoking status: Never Smoker    ??? Smokeless tobacco: No   ??? Alcohol Use: No   ??? Drug Use: No   ??? Sexual Activity: No                ALLERGIES: Pcn      Review of Systems   Constitutional: Negative for fever and chills.   HENT: Negative for sore throat.    Respiratory: Negative for cough and shortness of breath.    Cardiovascular: Positive for chest pain. Negative for palpitations.   Gastrointestinal: Negative for nausea, vomiting, abdominal pain and diarrhea.   Genitourinary: Negative for dysuria and difficulty urinating.   Musculoskeletal: Positive for myalgias (left arm). Negative for back pain and neck pain.   Skin: Negative for rash.   Neurological: Negative for weakness and headaches.    All other systems reviewed and are negative.      Filed Vitals:    07/21/14 0115 07/21/14 0130 07/21/14 0145 07/21/14 0215   BP: 114/68 107/65 107/69 100/60   Pulse: 88 73 67 60   Temp:       Resp: 21 16 15 13    Height:       Weight:       SpO2:                Physical Exam   Constitutional: He is oriented to person, place, and time. He appears well-developed and well-nourished.   HENT:   Head: Normocephalic and atraumatic.   Right Ear: External ear normal.   Left Ear: External ear normal.   Nose: Nose normal.   Mouth/Throat: Oropharynx is clear and moist.   seborrhea present to the nose.    Eyes: Conjunctivae and EOM are normal. Pupils are equal, round, and reactive to light. Right eye exhibits no discharge. No scleral icterus.   Neck: Normal range of motion. Neck supple. No JVD present. No thyromegaly present.   Cardiovascular: Normal rate,  regular rhythm and intact distal pulses.  Exam reveals no gallop and no friction rub.    No murmur heard.  Pulmonary/Chest: No respiratory distress. He has no wheezes. He has no rales. He exhibits no tenderness.   Abdominal: He exhibits no distension and no mass. There is no tenderness. There is no rebound and no guarding.   Musculoskeletal: Normal range of motion. He exhibits no edema.   Lymphadenopathy:     He has no cervical adenopathy.   Neurological: He is alert and oriented to person, place, and time. No cranial nerve deficit. Coordination normal.   Skin: Skin is warm and dry. No rash noted. No erythema.   Psychiatric: He has a normal mood and affect. His behavior is normal. Judgment normal.        MDM  Number of Diagnoses or Management Options  Diagnosis management comments: Chest pain, differential to include coronary artery disease related,pericardial disease, vascular disease, PE, esophageal or gastric conditions, gallbladder disease,musculoskeletal abnormalities,other possible etiologies.Known CAD with stents.         Amount and/or Complexity of Data Reviewed   Clinical lab tests: ordered and reviewed  Tests in the radiology section of CPT??: ordered        Procedures: none        Medications ordered:   Medications - No data to display      Lab findings:  Labs Reviewed   CBC WITH AUTOMATED DIFF - Abnormal; Notable for the following:     RBC 4.04 (*)     HGB 10.7 (*)     HCT 33.5 (*)     RDW 15.5 (*)     MPV 8.9 (*)     MONOCYTES 13 (*)     EOSINOPHILS 7 (*)     All other components within normal limits   METABOLIC PANEL, COMPREHENSIVE - Abnormal; Notable for the following:     Glucose 107 (*)     BUN/Creatinine ratio 11 (*)     Calcium 8.4 (*)     AST 10 (*)     All other components within normal limits   MAGNESIUM   CARDIAC PANEL,(CK, CKMB & TROPONIN)   PRO-BNP   URINALYSIS W/ RFLX MICROSCOPIC   CARDIAC PANEL,(CK, CKMB & TROPONIN)       EKG interpretation:  Rhythm: NSR  Rate: 65 bpm  Interpretation: RBBB and no STEMI     Radiology:  XR CHEST SNGL V  Negative, interpret by me     Consults:   none    ED Course:  Pt had negative cardiac enzymes on this occasion and chart reviewed he had negative cardiac enzymes as well on previous visit. Upon re-evaluation the patient's symptoms have improved. Pt has non-toxic appearance and condition is stable for discharge. He was informed of lab results and refer to have a stress test within 72 hours. All questions and concerns were addressed.      Disposition:  Patient was discharge in stable condition.        Production assistant, radio  (3:34 AM) Documented by: Oletta Darter. Lonna Cobb scribing for and in the presence of Thomes Dinning, MD      Physician Attestation    I personally performed the services described in the documentation, reviewed the documentation, as recorded by the scribe in my presence, and it accurately and completely records my words and actions.    Thomes Dinning, MD

## 2014-07-21 NOTE — ED Notes (Signed)
Pt aox4. Vitals stable. Pt denies chest pain. Pt denies SOB. I have reviewed discharge instructions with the patient.  The patient verbalized understanding.Patient armband removed and given to patient to take home.  Patient was informed of the privacy risks if armband lost or stolen.

## 2014-07-22 LAB — EKG, 12 LEAD, INITIAL
Atrial Rate: 92 {beats}/min
Calculated P Axis: 72 degrees
Calculated R Axis: 80 degrees
Calculated T Axis: 51 degrees
Diagnosis: NORMAL
P-R Interval: 124 ms
Q-T Interval: 374 ms
QRS Duration: 130 ms
QTC Calculation (Bezet): 462 ms
Ventricular Rate: 92 {beats}/min

## 2014-07-22 LAB — EKG, 12 LEAD, SUBSEQUENT
Atrial Rate: 65 {beats}/min
Calculated P Axis: 57 degrees
Calculated R Axis: 60 degrees
Calculated T Axis: 41 degrees
Diagnosis: NORMAL
P-R Interval: 130 ms
Q-T Interval: 444 ms
QRS Duration: 124 ms
QTC Calculation (Bezet): 461 ms
Ventricular Rate: 65 {beats}/min

## 2014-07-24 NOTE — Progress Notes (Signed)
Dr. Pearletha AlfredBumanglag's new patient was a no show on 07/17/14. Discharge letter was sent on 07/24/14. DMA.RAFD. 8:17am.

## 2016-08-12 ENCOUNTER — Observation Stay (HOSPITAL_COMMUNITY): Admission: EM | Admit: 2016-08-12 | Discharge: 2016-08-13 | Attending: Family Medicine | Admitting: Family Medicine

## 2016-08-12 ENCOUNTER — Encounter (HOSPITAL_COMMUNITY): Payer: Self-pay

## 2016-08-12 ENCOUNTER — Emergency Department (HOSPITAL_COMMUNITY)

## 2016-08-12 DIAGNOSIS — I25110 Atherosclerotic heart disease of native coronary artery with unstable angina pectoris: Secondary | ICD-10-CM | POA: Diagnosis present

## 2016-08-12 DIAGNOSIS — R079 Chest pain, unspecified: Secondary | ICD-10-CM | POA: Diagnosis present

## 2016-08-12 DIAGNOSIS — I10 Essential (primary) hypertension: Secondary | ICD-10-CM | POA: Diagnosis not present

## 2016-08-12 DIAGNOSIS — R0789 Other chest pain: Secondary | ICD-10-CM | POA: Diagnosis not present

## 2016-08-12 DIAGNOSIS — D696 Thrombocytopenia, unspecified: Secondary | ICD-10-CM | POA: Diagnosis not present

## 2016-08-12 DIAGNOSIS — I2510 Atherosclerotic heart disease of native coronary artery without angina pectoris: Secondary | ICD-10-CM | POA: Diagnosis not present

## 2016-08-12 DIAGNOSIS — I959 Hypotension, unspecified: Secondary | ICD-10-CM | POA: Diagnosis not present

## 2016-08-12 DIAGNOSIS — I252 Old myocardial infarction: Secondary | ICD-10-CM | POA: Diagnosis not present

## 2016-08-12 DIAGNOSIS — E785 Hyperlipidemia, unspecified: Secondary | ICD-10-CM | POA: Diagnosis not present

## 2016-08-12 DIAGNOSIS — Z955 Presence of coronary angioplasty implant and graft: Secondary | ICD-10-CM | POA: Diagnosis not present

## 2016-08-12 DIAGNOSIS — Z880 Allergy status to penicillin: Secondary | ICD-10-CM | POA: Diagnosis not present

## 2016-08-12 DIAGNOSIS — R001 Bradycardia, unspecified: Secondary | ICD-10-CM | POA: Diagnosis not present

## 2016-08-12 DIAGNOSIS — Z7982 Long term (current) use of aspirin: Secondary | ICD-10-CM | POA: Diagnosis not present

## 2016-08-12 DIAGNOSIS — I200 Unstable angina: Secondary | ICD-10-CM

## 2016-08-12 DIAGNOSIS — Z79899 Other long term (current) drug therapy: Secondary | ICD-10-CM | POA: Diagnosis not present

## 2016-08-12 DIAGNOSIS — I25810 Atherosclerosis of coronary artery bypass graft(s) without angina pectoris: Secondary | ICD-10-CM

## 2016-08-12 DIAGNOSIS — Z87891 Personal history of nicotine dependence: Secondary | ICD-10-CM | POA: Diagnosis not present

## 2016-08-12 HISTORY — DX: Essential (primary) hypertension: I10

## 2016-08-12 HISTORY — DX: Atherosclerotic heart disease of native coronary artery without angina pectoris: I25.10

## 2016-08-12 HISTORY — DX: Palpitations: R00.2

## 2016-08-12 HISTORY — DX: Hyperlipidemia, unspecified: E78.5

## 2016-08-12 LAB — CBC
HEMATOCRIT: 40.1 % (ref 39.0–52.0)
HEMOGLOBIN: 13.6 g/dL (ref 13.0–17.0)
MCH: 30.9 pg (ref 26.0–34.0)
MCHC: 33.9 g/dL (ref 30.0–36.0)
MCV: 91.1 fL (ref 78.0–100.0)
Platelets: 129 10*3/uL — ABNORMAL LOW (ref 150–400)
RBC: 4.4 MIL/uL (ref 4.22–5.81)
RDW: 12.2 % (ref 11.5–15.5)
WBC: 4.6 10*3/uL (ref 4.0–10.5)

## 2016-08-12 LAB — I-STAT TROPONIN, ED: Troponin i, poc: 0 ng/mL (ref 0.00–0.08)

## 2016-08-12 LAB — BASIC METABOLIC PANEL
ANION GAP: 8 (ref 5–15)
BUN: 10 mg/dL (ref 6–20)
CHLORIDE: 107 mmol/L (ref 101–111)
CO2: 23 mmol/L (ref 22–32)
Calcium: 9 mg/dL (ref 8.9–10.3)
Creatinine, Ser: 1.05 mg/dL (ref 0.61–1.24)
GFR calc Af Amer: >60 mL/min (ref >60)
GLUCOSE: 85 mg/dL (ref 65–99)
POTASSIUM: 3.6 mmol/L (ref 3.5–5.1)
Sodium: 138 mmol/L (ref 135–145)

## 2016-08-12 LAB — TROPONIN I

## 2016-08-12 MED ORDER — ENOXAPARIN SODIUM 30 MG/0.3ML ~~LOC~~ SOLN: 30.0000 mg | SUBCUTANEOUS | Status: DC

## 2016-08-12 MED ORDER — ACETAMINOPHEN 325 MG PO TABS: 650.0000 mg | ORAL_TABLET | ORAL | Status: DC | PRN

## 2016-08-12 MED ORDER — CLOPIDOGREL BISULFATE 75 MG PO TABS
75.0000 mg | ORAL_TABLET | Freq: Every day | ORAL | Status: DC
  Administered 2016-08-13: 75 mg via ORAL
  Filled 2016-08-12: qty 1

## 2016-08-12 MED ORDER — ISOSORBIDE MONONITRATE ER 30 MG PO TB24
30.0000 mg | ORAL_TABLET | Freq: Every day | ORAL | Status: DC
  Administered 2016-08-13: 30 mg via ORAL
  Filled 2016-08-12: qty 1

## 2016-08-12 MED ORDER — ONDANSETRON HCL 4 MG/2ML IJ SOLN: 4.0000 mg | Freq: Four times a day (QID) | INTRAMUSCULAR | Status: DC | PRN

## 2016-08-12 MED ORDER — MORPHINE SULFATE (PF) 4 MG/ML IV SOLN
2.0000 mg | INTRAVENOUS | Status: DC | PRN
  Administered 2016-08-12 – 2016-08-13 (×5): 2 mg via INTRAVENOUS
  Filled 2016-08-12 (×5): qty 1

## 2016-08-12 MED ORDER — ASPIRIN EC 81 MG PO TBEC
81.0000 mg | DELAYED_RELEASE_TABLET | Freq: Every day | ORAL | Status: DC
  Administered 2016-08-13 – 2016-08-14 (×2): 81 mg via ORAL
  Filled 2016-08-12 (×2): qty 1

## 2016-08-12 MED ORDER — MORPHINE SULFATE (PF) 4 MG/ML IV SOLN
4.0000 mg | Freq: Once | INTRAVENOUS | Status: AC
  Administered 2016-08-12: 4 mg via INTRAVENOUS
  Filled 2016-08-12: qty 1

## 2016-08-12 MED ORDER — METOPROLOL SUCCINATE ER 25 MG PO TB24: 25.0000 mg | ORAL_TABLET | Freq: Every day | ORAL | Status: DC

## 2016-08-12 MED ORDER — SODIUM CHLORIDE 0.9 % IV BOLUS (SEPSIS)
1000.0000 mL | Freq: Once | INTRAVENOUS | Status: AC
  Administered 2016-08-12: 1000 mL via INTRAVENOUS

## 2016-08-12 MED ORDER — ACETAMINOPHEN 325 MG PO TABS: 650.0000 mg | ORAL_TABLET | Freq: Three times a day (TID) | ORAL | Status: DC | PRN

## 2016-08-12 MED ORDER — ATORVASTATIN CALCIUM 20 MG PO TABS
20.0000 mg | ORAL_TABLET | Freq: Every day | ORAL | Status: DC
  Administered 2016-08-12: 20 mg via ORAL
  Filled 2016-08-12: qty 1

## 2016-08-12 MED ORDER — ENOXAPARIN SODIUM 40 MG/0.4ML ~~LOC~~ SOLN
40.0000 mg | Freq: Every day | SUBCUTANEOUS | Status: DC
Start: 2016-08-12 — End: 2016-08-14
  Administered 2016-08-12 – 2016-08-13 (×2): 40 mg via SUBCUTANEOUS
  Filled 2016-08-12 (×2): qty 0.4

## 2016-08-12 NOTE — ED Provider Notes (Signed)
[facility]-EMERGENCY DEPT Provider Note   CSN: [id number] Arrival date & time: 08/12/16  1319     History   Chief Complaint Chief Complaint  Patient presents with  . Chest Pain    HPI Terrance Joseph is a 51 y.o. male.  HPI  51 y.o. male with a hx HTN, HLD, Stent placement in 1996, presents to the Emergency Department today complaining of chest pain with onset this morning around 0900. Pt is incarcerated. Pt states he was waiting at a bus station when he started having chest pain while standing. Notes hx same in 1996 when he had stent placement. Pt states pain is intermittent with bouts lasting 10 minutes with rest for 5 min at a time. Notes diaphoresis with mild nausea. No emesis. No fevers. Pt took x3 NTG SL without relief. Given ASA by prison medical staff and given another nitro by EMS without relief. Rates pain currently a 9/10, but notes that it is intermittent. States that it feels like a pressure sensation with radiation into left neck and left shoulder. No other symptoms noted.   No past medical history on file.  There are no active problems to display for this patient.   Past Surgical History:  Procedure Laterality Date  . CORONARY STENT INTERVENTION  1996   Patient Reports       Home Medications    Prior to Admission medications   Not on File    Family History No family history on file.  Social History Social History  Substance Use Topics  . Smoking status: Former Smoker    Types: Cigarettes    Quit date: 08/12/1998  . Smokeless tobacco: Never Used  . Alcohol use No     Allergies   Penicillins   Review of Systems Review of Systems ROS reviewed and all are negative for acute change except as noted in the HPI.  Physical Exam Updated Vital Signs BP 123/86   Pulse (!) 49   Resp 18   Ht 5\' 11"  (1.803 m)   Wt 81.6 kg   SpO2 100%   BMI 25.10 kg/m   Physical Exam  Constitutional: He is oriented to person, place, and time. Vital signs are normal. He  appears well-developed and well-nourished.  HENT:  Head: Normocephalic and atraumatic.  Right Ear: Hearing normal.  Left Ear: Hearing normal.  Eyes: Conjunctivae and EOM are normal. Pupils are equal, round, and reactive to light.  Neck: Normal range of motion.  Cardiovascular: Normal rate, regular rhythm, normal heart sounds and intact distal pulses.   No murmur heard. Pulmonary/Chest: Effort normal and breath sounds normal. No respiratory distress. He has no decreased breath sounds. He has no wheezes. He has no rales.  Abdominal: Soft. Bowel sounds are normal. There is no tenderness. There is no rigidity, no rebound, no guarding, no CVA tenderness, no tenderness at McBurney's point and negative Murphy's sign.  Musculoskeletal: Normal range of motion.  Neurological: He is alert and oriented to person, place, and time.  Skin: Skin is warm and dry.  Psychiatric: He has a normal mood and affect. His speech is normal and behavior is normal. Thought content normal.  Nursing note and vitals reviewed.  ED Treatments / Results  Labs (all labs ordered are listed, but only abnormal results are displayed) Labs Reviewed  BASIC METABOLIC PANEL  CBC  I-STAT TROPOININ, ED   EKG  EKG Interpretation  Date/Time:  Tuesday August 12 2016 13:26:18 EST Ventricular Rate:  65 PR Interval:  QRS Duration: 131 QT Interval:  444 QTC Calculation: 462 R Axis:   81 Text Interpretation:  Sinus rhythm Right bundle branch block No prior EKGs Confirmed by Fayrene FearingJAMES  MD, Terrance ([id number]) on 08/12/2016 2:00:15 PM Also confirmed by Fayrene FearingJAMES  MD, Terrance ([id number]), editor [place]  CCT, BEVERLY ([id number])  on 08/12/2016 2:05:46 PM      Radiology No results found.  Procedures Procedures (including critical care time)  Medications Ordered in ED Medications - No data to display   Initial Impression / Assessment and Plan / ED Course  I have reviewed the triage vital signs and the nursing notes.  Pertinent labs & imaging  results that were available during my care of the patient were reviewed by me and considered in my medical decision making (see chart for details).    Final Clinical Impressions(s) / ED Diagnoses  {I have reviewed and evaluated the relevant laboratory values. {I have reviewed and evaluated the relevant imaging studies. {I have interpreted the relevant EKG. {I have reviewed the relevant previous healthcare records. {I have reviewed EMS Documentation. {I obtained HPI from historian. {Patient discussed with supervising physician.  ED Course:  Assessment: Pt is a 50yM hx HTN, HLD, Stent placement in 1996 presents with CP with onset this AM around 0900. Hx same in 1996 with stent placement. Painn in intermittent. Noted nausea and diaphoresis. Given analgesia in ED. Concern for cardiac etiology of Chest Pain. Medicine has been consulted and will see patient in the ED for likely admit. Pt does not meet criteria for CP protocol and a further evaluation is recommended. Pt has been re-evaluated prior to consult and VSS, NAD, heart RRR, pain 0/10, lungs CTAB. No acute abnormalities found on EKG. Trop pending. Heart Score 4 if troponin negative. Will admit to medicine for CP rule out.   Disposition/Plan:  Plan is to Admit 3:18 PM- Sign out to Terrance Okyan Franasiak, MD Pt acknowledges and agrees with plan  Supervising Physician Rolland PorterMark James, MD  Final diagnoses:  Chest pain, unspecified type    New Prescriptions New Prescriptions   No medications on file     Audry Piliyler Julieta Rogalski, PA-C 08/12/16 1519    Rolland PorterMark James, MD 08/24/16 [phone]

## 2016-08-12 NOTE — H&P (Signed)
[facility] History and Physical Service Pager: [phone]  Patient name: Terrance MunchRoger Abadi Medical record number: [id number] Date of birth: 05/05/1966 Age: 51 y.o. Gender: male  Primary Care Provider: No PCP Per Patient Consultants: None Code Status: Full  Chief Complaint: chest pain  Assessment and Plan: Terrance Joseph is a 51 y.o. male presenting for ACS rule-out. PMH is significant for HLD, CAD, HTN, and former tobacco abuse.   Chest pain: Heart score of 6 for age, prior history of MI, T-wave inversion in V2, and highly suspicious history. I-stat troponin negative. Responds well to IV morphine. No chest wall pain on exam. CXR negative for infiltrate or effusion; loop recorder present. No prior records of last work-up available.  - Admit to Observation, telemetry -- Attending Dr. Lum BabeEniola - Trend troponins x 3 q6h - IV morphine 2mg  q2h prn for chest pain - Repeat EKG in a.m. - continue daily aspirin 81 mg - continue home plavix - Perform risk stratification labs: A1c, lipid panel, TSH - Order ECHO to rule out other etiologies of chest pain like wall motion abnormality - Would consult Cardiology in a.m. if troponins increasing. Could consider repeat chemical stress test.   HLD: - Continue home lipitor 20 mg daily, would recommend increasing to 40 mg upon discharge - Check lipid panel  HTN: Well controlled on admission.  - Continue home imdur 30 mg daily and toprol 25 mg daily  FEN/GI: heart healthy diet Prophylaxis: lovenox  Disposition: Pending ACS work-up  History of Present Illness:  Terrance Joseph is a 51 y.o. male presenting with acute onset chest pain. Began this morning when riding on the bus. Pressure continued to intensify and began to radiate to neck and left arm. Patient also reports feeling clammy. He took nitro x 3 and asa 324 mg with slight improvement. Normal EKG when evaluated by EMS per ED provider report. Patient says he has  not had this kind of pain since his last MI in 1996 when 3 stents needed to be placed. [facility] with a positive chemical stress test. He does not have a regular Cardiologist and is currently incarcerated. He denies increased stress, although he has not heard from his mother since December, which does worry him. He denies having trouble taking his medications recently. Morphine has brought pain down to a 0 but had increased to a 9/10 at time of evaluation. He says he has otherwise felt well and only feels SOB when pain at its worst.  Review Of Systems: Per HPI with the following additions:   Review of Systems  Constitutional: Negative for chills and fever.  HENT: Negative for congestion, ear pain and sore throat.   Eyes: Negative for double vision and pain.  Respiratory: Negative for cough and wheezing.   Cardiovascular: Positive for chest pain. Negative for palpitations and leg swelling.  Gastrointestinal: Negative for nausea and vomiting.  Genitourinary: Negative for dysuria and frequency.  Musculoskeletal: Negative for back pain and myalgias.  Skin: Negative for rash.  Neurological: Negative for focal weakness, weakness and headaches.    Patient Active Problem List   Diagnosis Date Noted  . Left sided chest pain 08/12/2016    Past Medical History: No past medical history on file.  Past Surgical History: Past Surgical History:  Procedure Laterality Date  . CORONARY STENT INTERVENTION  1996   Patient Reports    Social History: Social History  Substance Use Topics  . Smoking status: Former  Smoker    Types: Cigarettes    Quit date: 08/12/1998  . Smokeless tobacco: Never Used  . Alcohol use No   Additional social history: Former smoker, 2 packs x 5 years, no alcohol use. Lives in [place], [place] with wife but currently incarcerated.  Please also refer to relevant sections of EMR.  Family History: No family history on file. Heart disease  of uncle, grandmother and cousin -- maternal side  Allergies and Medications: Allergies  Allergen Reactions  . Penicillins Anaphylaxis    Has patient had a PCN reaction causing immediate rash, facial/tongue/throat swelling, SOB or lightheadedness with hypotension: Yes Has patient had a PCN reaction causing severe rash involving mucus membranes or skin necrosis: No Has patient had a PCN reaction that required hospitalization: No Has patient had a PCN reaction occurring within the last 10 years: Yes If all of the above answers are "NO", then may proceed with Cephalosporin use.    No current facility-administered medications on file prior to encounter.    No current outpatient prescriptions on file prior to encounter.    Objective: BP 122/78   Pulse (!) 57   Resp 11   Ht 5\' 11"  (1.803 m)   Wt 180 lb (81.6 kg)   SpO2 100%   BMI 25.10 kg/m  Exam: General: Well-appearing, thin male in NAD Eyes: PERRLA, EOMI ENTM: No nasal congestion, poor dentition Neck: Supple, no JVD Cardiovascular: RRR, S1, S2, no m/r/g Respiratory: Speaking easily in complete sentences. Moving air well throughout. No wheezing.  Gastrointestinal: +BS, soft, NT, ND.  MSK: Strength intact. No LE edema.  Derm: No rashes or bruising noted on exposed skin (chest, legs, back) Neuro: AOx3, no focal deficits Psych: Talkative. Normal affect.   Labs and Imaging: CBC BMET   Recent Labs Lab 08/12/16 1450  WBC 4.6  HGB 13.6  HCT 40.1  PLT 129*    Recent Labs Lab 08/12/16 1450  NA 138  K 3.6  CL 107  CO2 23  BUN 10  CREATININE 1.05  GLUCOSE 85  CALCIUM 9.0     Dg Chest 2 View  Result Date: 08/12/2016 CLINICAL DATA:  Chest pain today EXAM: CHEST  2 VIEW COMPARISON:  None. FINDINGS: No active infiltrate or effusion is seen. Probable nipple shadow is noted at the right lung base. Mediastinal and hilar contours are unremarkable. The heart is within normal limits in size. An implanted loop recorder overlies  the anterior left chest. No bony abnormality is seen. IMPRESSION: No active lung disease.  Implanted loop recorder. Electronically Signed   By: Dwyane Dee M.D.   On: 08/12/2016 15:33    Bilaal Leib Percell Boston, MD 08/12/2016, 5:06 PM PGY-2, [facility] Family Medicine FPTS Intern pager: [phone], text pages welcome

## 2016-08-12 NOTE — ED Provider Notes (Signed)
Received care of patient from a PA Audry Piliyler Mohr at 3 PM. PMHx of CAD (stents x3 1996), HTN, & HLD. Symptom onset several hours prior to arrival. Left-sided chest pressure radiating to left arm associated with shortness of breath, nausea, and diaphoresis. Patient took NO SL x3 & ASA 324mg . EKG here showing T-wave inversion in V2 and biphasic T-wave in V3. No previous EKG for comparison. Last provocative testing was 5 years ago.  Pt admitted to medicine for further evaluation and management of ACS evaluation with tentative plan for telemetry, serial troponins, and repeat provocative testing. Pt understands and agrees with the plan and has no further questions or concerns.   Pt care discussed with and followed by my attending, Dr. Marily MemosJason Mesner  Angelina Okyan Mikahla Wisor, MD Pager [phone]  ED Disposition    ED Disposition Condition Comment   Admit  [facility] [[id number][facility]  Level of Care: Telemetry [5]  Diagnosis: Left sided chest pain [9604540][[id number]]  Admitting Physician: Doreene ElandENIOLA, KEHINDE T [[id number]]  Attending Physician: Janit PaganENIOLA, KEHINDE T [[id number]]  PT Class (Do Not Modify): Observation [104]  PT Acc Code (Do Not Modify): Observation [10022]         Angelina Okyan Yvon Mccord, MD 08/12/16 1735    Marily MemosJason Mesner, MD 08/12/16 [id number]

## 2016-08-12 NOTE — ED Triage Notes (Signed)
Patient transported via EMS. Patient is a prisoner with the [employer]. He began reporting chest pain while at a bus terminal on the way to a work [place]. He was in possession of Nitroglycerin and took 3 doses (last dose at 11:55am) without relief. The facility provided him a 325mg  dose of Asprin. EMS reports he was diaphoretic and still complaining of Chest pain on arrival so they gave another dose of Nitro. [employer] [employer] staff denies the patient has a stent or cardiac history but the patient advises he has a history of MI and had multiple stents placed in [place].

## 2016-08-13 ENCOUNTER — Observation Stay (HOSPITAL_BASED_OUTPATIENT_CLINIC_OR_DEPARTMENT_OTHER)

## 2016-08-13 ENCOUNTER — Encounter (HOSPITAL_COMMUNITY): Admission: EM | Payer: Self-pay | Source: Home / Self Care | Attending: Emergency Medicine

## 2016-08-13 ENCOUNTER — Encounter (HOSPITAL_COMMUNITY): Payer: Self-pay | Admitting: Family Medicine

## 2016-08-13 DIAGNOSIS — E785 Hyperlipidemia, unspecified: Secondary | ICD-10-CM | POA: Diagnosis not present

## 2016-08-13 DIAGNOSIS — I200 Unstable angina: Secondary | ICD-10-CM

## 2016-08-13 DIAGNOSIS — R0789 Other chest pain: Principal | ICD-10-CM | POA: Insufficient documentation

## 2016-08-13 DIAGNOSIS — Z79899 Other long term (current) drug therapy: Secondary | ICD-10-CM | POA: Insufficient documentation

## 2016-08-13 DIAGNOSIS — E7800 Pure hypercholesterolemia, unspecified: Secondary | ICD-10-CM | POA: Diagnosis not present

## 2016-08-13 DIAGNOSIS — D696 Thrombocytopenia, unspecified: Secondary | ICD-10-CM | POA: Insufficient documentation

## 2016-08-13 DIAGNOSIS — Z955 Presence of coronary angioplasty implant and graft: Secondary | ICD-10-CM | POA: Insufficient documentation

## 2016-08-13 DIAGNOSIS — I959 Hypotension, unspecified: Secondary | ICD-10-CM | POA: Insufficient documentation

## 2016-08-13 DIAGNOSIS — I10 Essential (primary) hypertension: Secondary | ICD-10-CM | POA: Insufficient documentation

## 2016-08-13 DIAGNOSIS — R079 Chest pain, unspecified: Secondary | ICD-10-CM

## 2016-08-13 DIAGNOSIS — Z7982 Long term (current) use of aspirin: Secondary | ICD-10-CM | POA: Insufficient documentation

## 2016-08-13 DIAGNOSIS — I2510 Atherosclerotic heart disease of native coronary artery without angina pectoris: Secondary | ICD-10-CM | POA: Insufficient documentation

## 2016-08-13 DIAGNOSIS — R001 Bradycardia, unspecified: Secondary | ICD-10-CM | POA: Insufficient documentation

## 2016-08-13 DIAGNOSIS — I25709 Atherosclerosis of coronary artery bypass graft(s), unspecified, with unspecified angina pectoris: Secondary | ICD-10-CM

## 2016-08-13 DIAGNOSIS — I252 Old myocardial infarction: Secondary | ICD-10-CM | POA: Insufficient documentation

## 2016-08-13 DIAGNOSIS — Z87891 Personal history of nicotine dependence: Secondary | ICD-10-CM | POA: Insufficient documentation

## 2016-08-13 DIAGNOSIS — Z880 Allergy status to penicillin: Secondary | ICD-10-CM | POA: Insufficient documentation

## 2016-08-13 HISTORY — PX: LEFT HEART CATH AND CORONARY ANGIOGRAPHY: CATH118249

## 2016-08-13 LAB — LIPID PANEL
Cholesterol: 103 mg/dL (ref 0–200)
HDL: 46 mg/dL (ref >40)
LDL CALC: 46 mg/dL (ref 0–99)
Total CHOL/HDL Ratio: 2.2 RATIO
Triglycerides: 55 mg/dL (ref <150)
VLDL: 11 mg/dL (ref 0–40)

## 2016-08-13 LAB — ECHOCARDIOGRAM COMPLETE
HEIGHTINCHES: 71 in
WEIGHTICAEL: 2606.72 [oz_av]

## 2016-08-13 LAB — TROPONIN I

## 2016-08-13 LAB — TSH: TSH: 2.304 u[IU]/mL (ref 0.350–4.500)

## 2016-08-13 LAB — MRSA PCR SCREENING: MRSA by PCR: NEGATIVE

## 2016-08-13 SURGERY — LEFT HEART CATH AND CORONARY ANGIOGRAPHY
Anesthesia: LOCAL

## 2016-08-13 MED ORDER — SODIUM CHLORIDE 0.9% FLUSH
3.0000 mL | Freq: Two times a day (BID) | INTRAVENOUS | Status: DC
  Administered 2016-08-14: 3 mL via INTRAVENOUS

## 2016-08-13 MED ORDER — SODIUM CHLORIDE 0.9 % IV SOLN: INTRAVENOUS | Status: AC

## 2016-08-13 MED ORDER — FENTANYL CITRATE (PF) 100 MCG/2ML IJ SOLN
INTRAMUSCULAR | Status: DC | PRN
  Administered 2016-08-13: 25 ug via INTRAVENOUS

## 2016-08-13 MED ORDER — SODIUM CHLORIDE 0.9 % IV SOLN: 250.0000 mL | INTRAVENOUS | Status: DC | PRN

## 2016-08-13 MED ORDER — LIDOCAINE HCL (PF) 1 % IJ SOLN
INTRAMUSCULAR | Status: AC
  Filled 2016-08-13: qty 30

## 2016-08-13 MED ORDER — IOPAMIDOL (ISOVUE-370) INJECTION 76%
INTRAVENOUS | Status: DC | PRN
  Administered 2016-08-13: 50 mL via INTRA_ARTERIAL

## 2016-08-13 MED ORDER — LIDOCAINE HCL (PF) 1 % IJ SOLN
INTRAMUSCULAR | Status: DC | PRN
  Administered 2016-08-13: 2 mL
  Administered 2016-08-13: 15 mL

## 2016-08-13 MED ORDER — FENTANYL CITRATE (PF) 100 MCG/2ML IJ SOLN
INTRAMUSCULAR | Status: AC
  Filled 2016-08-13: qty 2

## 2016-08-13 MED ORDER — SODIUM CHLORIDE 0.9 % IV SOLN
INTRAVENOUS | Status: DC | PRN
  Administered 2016-08-13: 74 mL/h via INTRAVENOUS
  Administered 2016-08-13 (×2): 500 mL

## 2016-08-13 MED ORDER — MIDAZOLAM HCL 2 MG/2ML IJ SOLN
INTRAMUSCULAR | Status: DC | PRN
  Administered 2016-08-13 (×2): 1 mg via INTRAVENOUS

## 2016-08-13 MED ORDER — HEPARIN (PORCINE) IN NACL 2-0.9 UNIT/ML-% IJ SOLN
INTRAMUSCULAR | Status: DC | PRN
  Administered 2016-08-13: 1000 mL

## 2016-08-13 MED ORDER — SODIUM CHLORIDE 0.9% FLUSH: 3.0000 mL | INTRAVENOUS | Status: DC | PRN

## 2016-08-13 MED ORDER — HEPARIN SODIUM (PORCINE) 1000 UNIT/ML IJ SOLN
INTRAMUSCULAR | Status: AC
  Filled 2016-08-13: qty 1

## 2016-08-13 MED ORDER — VERAPAMIL HCL 2.5 MG/ML IV SOLN
INTRAVENOUS | Status: AC
  Filled 2016-08-13: qty 2

## 2016-08-13 MED ORDER — SODIUM CHLORIDE 0.9 % WEIGHT BASED INFUSION: 1.0000 mL/kg/h | INTRAVENOUS | Status: DC

## 2016-08-13 MED ORDER — SODIUM CHLORIDE 0.9 % WEIGHT BASED INFUSION: 3.0000 mL/kg/h | INTRAVENOUS | Status: AC

## 2016-08-13 MED ORDER — IOPAMIDOL (ISOVUE-370) INJECTION 76%
INTRAVENOUS | Status: AC
  Filled 2016-08-13: qty 100

## 2016-08-13 MED ORDER — METOPROLOL TARTRATE 12.5 MG HALF TABLET
12.5000 mg | ORAL_TABLET | Freq: Two times a day (BID) | ORAL | Status: DC
  Filled 2016-08-13 (×2): qty 1

## 2016-08-13 MED ORDER — HEPARIN (PORCINE) IN NACL 2-0.9 UNIT/ML-% IJ SOLN
INTRAMUSCULAR | Status: AC
  Filled 2016-08-13: qty 1000

## 2016-08-13 MED ORDER — MIDAZOLAM HCL 2 MG/2ML IJ SOLN
INTRAMUSCULAR | Status: AC
  Filled 2016-08-13: qty 2

## 2016-08-13 SURGICAL SUPPLY — 14 items
CATH INFINITI 5FR MULTPACK ANG (CATHETERS) ×2 IMPLANT
COVER PRB 48X5XTLSCP FOLD TPE (BAG) ×1 IMPLANT
COVER PROBE 5X48 (BAG) ×1
GLIDESHEATH SLEND A-KIT 6F 22G (SHEATH) ×2 IMPLANT
GUIDEWIRE INQWIRE 1.5J.035X260 (WIRE) ×1 IMPLANT
INQWIRE 1.5J .035X260CM (WIRE) ×2
KIT HEART LEFT (KITS) ×2 IMPLANT
NEEDLE PERC ENTRY 21G 2.5CM (NEEDLE) ×2 IMPLANT
PACK CARDIAC CATHETERIZATION (CUSTOM PROCEDURE TRAY) ×2 IMPLANT
SHEATH PINNACLE 5F 10CM (SHEATH) ×2 IMPLANT
TRANSDUCER W/STOPCOCK (MISCELLANEOUS) ×2 IMPLANT
TUBING CIL FLEX 10 FLL-RA (TUBING) ×2 IMPLANT
WIRE EMERALD 3MM-J .035X150CM (WIRE) ×2 IMPLANT
WIRE MICROINTRODUCER 60CM (WIRE) ×2 IMPLANT

## 2016-08-13 NOTE — Progress Notes (Signed)
  Echocardiogram 2D Echocardiogram has been performed.  Terrance SavoyCasey N Tiann Saha 08/13/2016, 3:57 PM

## 2016-08-13 NOTE — Discharge Summary (Signed)
Family Medicine [facility] Summary  Patient name: Terrance MunchRoger Mood Medical record number: [id number] Date of birth: [birth year] Age: 51 y.o. Gender: male Date of Admission: 08/12/2016  Date of Discharge: 08/14/2016 Admitting Physician: Doreene ElandKehinde T Eniola, MD  Primary Care Provider: Ron ParkerJENKINS,HARVETTE C, MD Consultants: Cardiology  Indication for Hospitalization: Chest pain  Discharge Diagnoses/Problem List:  Noncardiac chest pain Retention Hyperlipidemia  Disposition: Prison  Discharge Condition: Stable, improved  Discharge Exam:  General: well nourished, well developed, in no acute distress with non-toxic appearance HEENT: normocephalic, atraumatic, moist mucous membranes Neck: supple, non-tender without lymphadenopathy, no JVD CV: regular rate and rhythm without murmurs, rubs, or gallops Lungs: clear to auscultation bilaterally with normal work of breathing Abdomen: soft, non-tender, no masses or organomegaly palpable, normoactive bowel sounds Skin: warm, dry, no rashes or lesions, cap refill < 2 seconds Extremities: warm and well perfused, normal tone  Brief Hospital Course:  Clifton JamesRoger Staffordis a 51 y.o.malepresenting for ACS rule-out. PMH is significant for HLD, CAD (stent x3 1996, on Plavix+ASA), HTN, and former tobacco abuse.   Patient experienced acute onset chest pain while at rest which intensified radiating to neck and left arm. Patient was started to feel clammy and attempted 3 nitroglycerin and an aspirin with slight improvement. Patient is a prisoner and was brought by authorities concerning for ACS.  Upon arrival to ED, vitals were stable. EKG unremarkable for ST changes or T-wave abnormalities. Troponins trended and negative 3. Chest pain was not reproducible on exam but seemed to have improved upon arrival. Patient without oxygen requirement during admission. CXR without cardiopulmonary disease with loop recorder implant. Patient noted history of PCI  with stents 3 in 1996, though these records were not available. Given heart score 6 and history of acute chest pain with suspected stents, cardiology was consult. Echo performed with preserved EF 60-65% without wall motion abnormalities. Cardiology performed LHC which was clean. Given these findings cardiology cleared patient was stable for discharge.  Issues for Follow Up:  1. As pain seems to be noncardiac in origin. Recommend Tylenol or reflux medication for chest pain. 2. He should instructed to continue aspirin and statin therapy on discharge. Given clean cath, patient was told to discontinue Plavix and Imdur. 3. Cholesterol well controlled on Lipitor 20 mg daily with ASCVD score 2.8%. 4. Patient instructed to discontinue Toprol-XL on discharge giving normal to low blood pressures. Consider restarting if blood pressures remain high.  Significant Procedures: LHC (08/13/2016)  Significant Labs and Imaging:   Recent Labs Lab 08/12/16 1450  WBC 4.6  HGB 13.6  HCT 40.1  PLT 129*    Recent Labs Lab 08/12/16 1450  NA 138  K 3.6  CL 107  CO2 23  GLUCOSE 85  BUN 10  CREATININE 1.05  CALCIUM 9.0   --I-STAT troponin: Negative --Troponin-I: Negative x2 --TSH: 2.304 (WNL) --A1c: 5.6 (WNL)  Imaging/Diagnostic Tests: Left Heart Cath and Coronary Angiography (08/13/2016) CONCLUSION: Angiographically normal coronary arteries with normal EF. Arterial hypotension. No evidence of previously placed Stents. Non-anginal chest pain with no evidence of stents that have been reported. Patient has an implantable loop recorder.  PLAN: Standard post cath care. Look for nonanginal etiology for chest pain. DC Plavix and Imdur.  Transthoracic Echocardiography (08/13/2016) CONCLUSIONS: Left ventricle: The cavity size was normal. There was mild concentric hypertrophy. Systolic function was normal. The estimated ejection fraction was in the range of 60% to 65%. Wall motion was normal; there were  no regional wall motion abnormalities. Left ventricular diastolic  function parameters were normal.  Aortic valve: Transvalvular velocity was within the normal range. There was no stenosis. There was no regurgitation.  Mitral valve: Transvalvular velocity was within the normal range. There was no evidence for stenosis. There was trivial regurgitation.  Right ventricle: The cavity size was normal. Wall thickness was normal. Systolic function was normal.  Atrial septum: No defect or patent foramen ovale was identified by color flow Doppler.  Tricuspid valve: There was no regurgitation.  DG Chest 2 View (08/12/2016) FINDINGS: No active infiltrate or effusion is seen. Probable nipple shadow is noted at the right lung base. Mediastinal and hilar contours are unremarkable. The heart is within normal limits in size. An implanted loop recorder overlies the anterior left chest. No bony abnormality is seen.  IMPRESSION: No active lung disease. Implanted loop recorder.  Results/Tests Pending at Time of Discharge: None  Discharge Medications:  Allergies as of 08/14/2016      Reactions   Penicillins Anaphylaxis   Has patient had a PCN reaction causing immediate rash, facial/tongue/throat swelling, SOB or lightheadedness with hypotension: Yes Has patient had a PCN reaction causing severe rash involving mucus membranes or skin necrosis: No Has patient had a PCN reaction that required hospitalization: No Has patient had a PCN reaction occurring within the last 10 years: Yes If all of the above answers are "NO", then may proceed with Cephalosporin use.      Medication List    STOP taking these medications   acetaminophen 325 MG tablet Commonly known as:  TYLENOL   isosorbide mononitrate 30 MG 24 hr tablet Commonly known as:  IMDUR   metoprolol succinate 25 MG 24 hr tablet Commonly known as:  TOPROL-XL     TAKE these medications   aspirin EC 81 MG tablet Take 81 mg by mouth daily.    atorvastatin 20 MG tablet Commonly known as:  LIPITOR Take 20 mg by mouth daily.       Discharge Instructions: Please refer to Patient Instructions section of EMR for full details.  Patient was counseled important signs and symptoms that should prompt return to medical care, changes in medications, dietary instructions, activity restrictions, and follow up appointments.   Follow-Up Appointments: [facility] to return to prison and will follow-up with physician at facility.  Wendee Beavers, DO 08/14/2016, 4:02 PM PGY-1, [facility] Family Medicine

## 2016-08-13 NOTE — Interval H&P Note (Signed)
History and Physical Interval Note:  08/13/2016 5:40 PM  Terrance Joseph  has presented today for surgery, with the diagnosis of Native Vessel CAD with Unstable Angina.  The various methods of treatment have been discussed with the patient and family. After consideration of risks, benefits and other options for treatment, the patient has consented to  Procedure(s): Left Heart Cath and Coronary Angiography (N/A) with Possible Percutaneous Coronary Intervention as a surgical intervention .  The patient's history has been reviewed, patient examined, no change in status, stable for surgery.  I have reviewed the patient's chart and labs.  Questions were answered to the patient's satisfaction.    Cath Lab Visit (complete for each Cath Lab visit)  Clinical Evaluation Leading to the Procedure:   ACS: Yes.    Non-ACS:    Anginal Classification: CCS IV  Anti-ischemic medical therapy: Maximal Therapy (2 or more classes of medications)  Non-Invasive Test Results: No non-invasive testing performed  Prior CABG: No previous CABG    Bryan Lemmaavid Kenric Ginger

## 2016-08-13 NOTE — Consult Note (Signed)
Cardiology Consult    Patient ID: Zykeem Bauserman MRN: [id number], DOB/AGE: Dec 13, 1965   Admit date: 08/12/2016 Date of Consult: 08/13/2016  Primary Physician: Ron Parker, MD Primary Cardiologist: New - seen by C. Clifton James, MD  Requesting Provider: T. McDiarmid  Patient Profile    51 y/o ? with a h/o CAD with reported h/o 3 DES in 2003, HTN, HL, and remote tob abuse, who was admitted 2/6 with chest pain.  Past Medical History   Past Medical History:  Diagnosis Date  . Coronary artery disease    a. 1996 s/p MI ([place] [place]) - pt says that he was told he needed stents but that they weren't placed @ that time;  b. 2003 PCI [facility]): He believes he had 3 drug eluting stents @ that time.  . Hyperlipidemia   . Hypertension   . Palpitations    a. 2003 s/p implantable loop recorder [facility] [place]).    Past Surgical History:  Procedure Laterality Date  . CORONARY STENT INTERVENTION  2003   Patient Reports     Allergies  Allergies  Allergen Reactions  . Penicillins Anaphylaxis    Has patient had a PCN reaction causing immediate rash, facial/tongue/throat swelling, SOB or lightheadedness with hypotension: Yes Has patient had a PCN reaction causing severe rash involving mucus membranes or skin necrosis: No Has patient had a PCN reaction that required hospitalization: No Has patient had a PCN reaction occurring within the last 10 years: Yes If all of the above answers are "NO", then may proceed with Cephalosporin use.     History of Present Illness    51 year old male with the above complex past medical history including coronary artery disease, hypertension, hyperlipidemia, palpitations. He says that he suffered a myocardial infarction in 1996. He says he underwent diagnostic catheterization at [facility] in [place]. Initially, he told us that he had 3 stents placed and that they were drug-coated stents. When it was pointed out that drug-eluting stents were not on the  market until 2003, he changed his story and said then it must been 2003 when he had stents placed and that would've been in [facility]. He says that now that he is thinking about it, in 1996, he underwent cath and was told he may need stents, but they were not replaced. In 2003, he was apparently admitted with chest pain and may be underwent stress testing which was abnormal. This then led to catheterization and stenting 3. He was apparently also having palpitations around that same time or possibly on the same admission and underwent implantation of a loop recorder. He says that he does not know if they ever captured any significant arrhythmias and has not seen cardiology since.  Patient is incarcerated. On February 6, he was being transferred from one present to another. In transit, he developed substernal left-sided chest discomfort with dyspnea. He took 2 sublingual nitroglycerin tablets while on the bus and upon arrival to the new facility, he continued to have chest pain. He had a headache after nitroglycerin with some but not complete reduction of chest pain. In that setting, EMS was called and he was taken to the [place]. Here, ECG showed right bundle branch block of unknown chronicity. He was treated with IV morphine with complete resolution of pain. He believes the total duration of chest pain on February 6 was about 35 minutes. He was subsequently admitted and has ruled out for MI. He has continued to have intermittent left-sided chest pressure, occurring  about every 2 hours, lasting about 15 minutes, and resolving with IV morphine.  Inpatient Medications    . aspirin EC  81 mg Oral Daily  . atorvastatin  20 mg Oral q1800  . clopidogrel  75 mg Oral Daily  . enoxaparin (LOVENOX) injection  40 mg Subcutaneous QHS  . isosorbide mononitrate  30 mg Oral Daily  . metoprolol tartrate  12.5 mg Oral BID    Family History    Family History  Problem Relation Age of Onset  . Hypertension Mother       Social History    Social History   Social History  . Marital status: Unknown    Spouse name: N/A  . Number of children: N/A  . Years of education: N/A   Occupational History  . Not on file.   Social History Main Topics  . Smoking status: Former Smoker    Years: 10.00    Types: Cigarettes    Quit date: 08/12/1998  . Smokeless tobacco: Never Used  . Alcohol use No  . Drug use: No  . Sexual activity: Not on file   Other Topics Concern  . Not on file   Social History Narrative   Pt is in prison.  He does not routinely exercise.     Review of Systems    General:  No chills, fever, night sweats or weight changes.  Cardiovascular:  +++ chest pain, +++ dyspnea, no orthopnea, palpitations, paroxysmal nocturnal dyspnea. Dermatological: No rash, lesions/masses Respiratory: No cough, +++ dyspnea Urologic: No hematuria, dysuria Abdominal:   No nausea, vomiting, diarrhea, bright red blood per rectum, melena, or hematemesis Neurologic:  No visual changes, wkns, changes in mental status. All other systems reviewed and are otherwise negative except as noted above.  Physical Exam    Blood pressure (!) 98/48, pulse 75, temperature 98.4 F (36.9 C), temperature source Oral, resp. rate 17, height 5\' 11"  (1.803 m), weight 162 lb 14.7 oz (73.9 kg), SpO2 96 %.  General: Pleasant, NAD Psych: Normal affect. Neuro: Alert and oriented X 3. Moves all extremities spontaneously. HEENT: Normal  Neck: Supple without bruits or JVD. Lungs:  Resp regular and unlabored, CTA. Heart: RRR no s3, s4, or murmurs. Abdomen: Soft, non-tender, non-distended, BS + x 4.  Extremities: No clubbing, cyanosis or edema. DP/PT/Radials 2+ and equal bilaterally.  Labs    Troponin [facility](Point of Care Test)  Recent Labs  08/12/16 1525  TROPIPOC 0.00    Recent Labs  08/12/16 2206 08/13/16 0241  TROPONINI <0.03 <0.03   Lab Results  Component Value Date   WBC 4.6 08/12/2016   HGB 13.6 08/12/2016   HCT  40.1 08/12/2016   MCV 91.1 08/12/2016   PLT 129 (L) 08/12/2016     Recent Labs Lab 08/12/16 1450  NA 138  K 3.6  CL 107  CO2 23  BUN 10  CREATININE 1.05  CALCIUM 9.0  GLUCOSE 85   Lab Results  Component Value Date   CHOL 103 08/13/2016   HDL 46 08/13/2016   LDLCALC 46 08/13/2016   TRIG 55 08/13/2016    Radiology Studies    Dg Chest 2 View  Result Date: 08/12/2016 CLINICAL DATA:  Chest pain today EXAM: CHEST  2 VIEW COMPARISON:  None. FINDINGS: No active infiltrate or effusion is seen. Probable nipple shadow is noted at the right lung base. Mediastinal and hilar contours are unremarkable. The heart is within normal limits in size. An implanted loop recorder overlies the anterior left chest.  No bony abnormality is seen. IMPRESSION: No active lung disease.  Implanted loop recorder. Electronically Signed   By: Dwyane Dee M.D.   On: 08/12/2016 15:33   ECG & Cardiac Imaging    RSR, 65, RBBB  Assessment & Plan    1.  Unstable Angina/CAD:  Pt with prior h/o CAD s/p prior DES (presumed to be in 2003) of unknown vessel.  He developed c/p @ rest yesterday while in transit from one correctional facility to another that was partially nitrate responsive.  Symptoms resolved with morphine in the ED.  He has r/o but has cont to have intermittent chest pain, for which he has been getting morphine.  Currently he is pain free.  Given [place] with recurrent c/p during admission, we will plan on diagnostic cath. The patient understands that risks include but are not limited to stroke (1 in 1000), death (1 in 1000), kidney failure [usually temporary] (1 in 500), bleeding (1 in 200), allergic reaction [possibly serious] (1 in 200), and agrees to proceed.  Cont asa, plavix,  blocker, statin, nitrate.  2.  Essential HTN:  Stable on  blocker.  3.  HL:  LDL 46.  Cont statin rx.  Signed, Nicolasa Ducking, NP 08/13/2016, 3:26 PM   I have personally seen and examined this patient with Ward Givens, NP. I  agree with the assessment and plan as outlined above. He is known to have CAD with prior stenting in 2003. He has chest pain worrisome for unstable angina. No dyspnea. Troponin is negative. EKG without ischemic changes. Exam shows a thin WM in NAD. CV:RRR no murmurs. Lungs: clear bilaterally. Abd: soft, NT. Ext: no edema.  EKG: sinus, RBBB Labs reviewed Plan: Unstable angina: Plan cardiac cath with possible PCI today. Risks and benefits of procedure reviewed with pt.   Verne Carrow 08/13/2016 4:14 PM

## 2016-08-13 NOTE — Progress Notes (Signed)
New Admission Note:   Arrival Method: From ED via stretcher  Mental Orientation: A&O Telemetry: Box 2w17 Assessment: Completed Skin: Intact IV: R FA Pain: denies any pain Tubes: None Safety Measures: Safety Fall Prevention Plan has been discussed  Admission 2 west Orientation: Patient has been orientated to the room, unit and staff.  Family:   Orders to be reviewed and implemented. Will continue to monitor the patient. Call light has been placed within reach and bed alarm has been activated. Tele Box applied. CCMD notified    Gregor HamsAlisha Shaleka Brines, RN

## 2016-08-13 NOTE — Progress Notes (Signed)
Family Medicine Teaching Service Daily Progress Note Intern Pager: [phone]  Patient name: Terrance Joseph Medical record number: [id number] Date of birth: 1965/10/06 Age: 51 y.o. Gender: male  Primary Care Provider: No PCP Per Patient Consultants: None Code Status: Full  Pt Overview and Major Events to Date:  02/06: Admit for ACS r/o 02/07: Cards consulted given continuous CP, questionable EKG and stent hx  Assessment and Plan: Browning Southwood is a 51 y.o. male presenting for ACS rule-out. PMH is significant for HLD, CAD (stent x3 1996, on Plavix+ASA), HTN, and former tobacco abuse.   #Atypical chest pain: HEART score of 6. Troponin negative x3. Responds well to IV morphine. CP intermittent and radiates to left neck and arm, not reproducible on exam. Initially associated with SOB and diaphoresis. CXR negative for infiltrate or effusion; loop recorder present. No prior records of last work-up available. Troponin neg. EKG sinus tachycardia w/o ST changes with T-wave inversions in lead V2 but no prior EKG available. TSH wnl. Lipid panel wnl. Overall symptoms seem concerning for ACS despite negative troponin. MSK pain not appreciated. Patient w/o good f/u for CAD and stent history. Would benefit from cardiology consult. --Cardiology consulted, appreciate recommendations --Cardiac monitoring --Continuous pulse ox --Tylenol PRN mild chest pain --IV morphine 2 mg q2h PRN severe chest pain --ASA 81 mg QD --Plavix 75 mg QD --A1c pending --Echocardiogram pending  --Zofran PRN  #HLD: Chronic, stable. Lipid panel wnl. On statin therapy. --Lipitor 20 mg QD, would benefit from increasing to 40 mg upon discharge  #HTN: Chronic, stable. Well controlled on admission.  --Imdur 30 mg QD --Switching Toprol-XL 25 mg QD to Lopressor 12.5 mg BID  FEN/GI: Heart healthy diet Prophylaxis: Lovenox SQ  Disposition: Pending ACS work-up.  Subjective:  Patient says he continues to have intermittent  L-sided chest pain which originally radiated to left neck. Denies SOB, nausea or vomiting at present.   Objective: Temp:  [97.9 F (36.6 C)-98.4 F (36.9 C)] 98.4 F (36.9 C) (02/07 0529) Pulse Rate:  [48-77] 77 (02/07 0529) Resp:  [11-20] 20 (02/07 0529) BP: (100-130)/(59-86) 104/77 (02/07 0529) SpO2:  [95 %-100 %] 97 % (02/07 0529) Weight:  [162 lb 14.7 oz (73.9 kg)-180 lb (81.6 kg)] 162 lb 14.7 oz (73.9 kg) (02/06 2210) Physical Exam: General: well nourished, well developed, in no acute distress with non-toxic appearance HEENT: normocephalic, atraumatic, moist mucous membranes Neck: supple, non-tender without lymphadenopathy, no JVD CV: regular rate and rhythm without murmurs, rubs, or gallops Lungs: clear to auscultation bilaterally with normal work of breathing Abdomen: soft, non-tender, no masses or organomegaly palpable, normoactive bowel sounds Skin: warm, dry, no rashes or lesions, cap refill < 2 seconds Extremities: warm and well perfused, normal tone  Laboratory:  Recent Labs Lab 08/12/16 1450  WBC 4.6  HGB 13.6  HCT 40.1  PLT 129*    Recent Labs Lab 08/12/16 1450  NA 138  K 3.6  CL 107  CO2 23  BUN 10  CREATININE 1.05  CALCIUM 9.0  GLUCOSE 85   Lipid Panel     Component Value Date/Time   CHOL 103 08/13/2016 0241   TRIG 55 08/13/2016 0241   HDL 46 08/13/2016 0241   CHOLHDL 2.2 08/13/2016 0241   VLDL 11 08/13/2016 0241   LDLCALC 46 08/13/2016 0241   --I-STAT troponin: Negative --Troponin-I: Negative x2 --TSH: 2.304 --A1c: Pending  Imaging/Diagnostic Tests: DG Chest 2 View (08/12/2016) FINDINGS: No active infiltrate or effusion is seen. Probable nipple shadow is noted at the right  lung base. Mediastinal and hilar contours are unremarkable. The heart is within normal limits in size. An implanted loop recorder overlies the anterior left chest. No bony abnormality is seen.  IMPRESSION: No active lung disease.  Implanted loop  recorder.    Wendee Beaversavid J McMullen, DO 08/13/2016, 7:23 AM PGY-1, [facility] FPTS Intern pager: [phone], text pages welcome

## 2016-08-13 NOTE — H&P (View-Only) (Signed)
Cardiology Consult    Patient ID: Terrance Joseph MRN: [id number], DOB/AGE: Dec 13, 1965   Admit date: 08/12/2016 Date of Consult: 08/13/2016  Primary Physician: Ron Parker, MD Primary Cardiologist: New - seen by C. Clifton James, MD  Requesting Provider: T. McDiarmid  Patient Profile    51 y/o ? with a h/o CAD with reported h/o 3 DES in 2003, HTN, HL, and remote tob abuse, who was admitted 2/6 with chest pain.  Past Medical History   Past Medical History:  Diagnosis Date  . Coronary artery disease    a. 1996 s/p MI ([place]) - pt says that he was told he needed stents but that they weren't placed @ that time;  b. 2003 PCI [facility]): He believes he had 3 drug eluting stents @ that time.  . Hyperlipidemia   . Hypertension   . Palpitations    a. 2003 s/p implantable loop recorder [facility] [place]).    Past Surgical History:  Procedure Laterality Date  . CORONARY STENT INTERVENTION  2003   Patient Reports     Allergies  Allergies  Allergen Reactions  . Penicillins Anaphylaxis    Has patient had a PCN reaction causing immediate rash, facial/tongue/throat swelling, SOB or lightheadedness with hypotension: Yes Has patient had a PCN reaction causing severe rash involving mucus membranes or skin necrosis: No Has patient had a PCN reaction that required hospitalization: No Has patient had a PCN reaction occurring within the last 10 years: Yes If all of the above answers are "NO", then may proceed with Cephalosporin use.     History of Present Illness    51 year old male with the above complex past medical history including coronary artery disease, hypertension, hyperlipidemia, palpitations. He says that he suffered a myocardial infarction in 1996. He says he underwent diagnostic catheterization at [facility] in [place]. Initially, he told us that he had 3 stents placed and that they were drug-coated stents. When it was pointed out that drug-eluting stents were not on the  market until 2003, he changed his story and said then it must been 2003 when he had stents placed and that would've been in [facility]. He says that now that he is thinking about it, in 1996, he underwent cath and was told he may need stents, but they were not replaced. In 2003, he was apparently admitted with chest pain and may be underwent stress testing which was abnormal. This then led to catheterization and stenting 3. He was apparently also having palpitations around that same time or possibly on the same admission and underwent implantation of a loop recorder. He says that he does not know if they ever captured any significant arrhythmias and has not seen cardiology since.  Patient is incarcerated. On February 6, he was being transferred from one present to another. In transit, he developed substernal left-sided chest discomfort with dyspnea. He took 2 sublingual nitroglycerin tablets while on the bus and upon arrival to the new facility, he continued to have chest pain. He had a headache after nitroglycerin with some but not complete reduction of chest pain. In that setting, EMS was called and he was taken to the [place]. Here, ECG showed right bundle branch block of unknown chronicity. He was treated with IV morphine with complete resolution of pain. He believes the total duration of chest pain on February 6 was about 35 minutes. He was subsequently admitted and has ruled out for MI. He has continued to have intermittent left-sided chest pressure, occurring  about every 2 hours, lasting about 15 minutes, and resolving with IV morphine.  Inpatient Medications    . aspirin EC  81 mg Oral Daily  . atorvastatin  20 mg Oral q1800  . clopidogrel  75 mg Oral Daily  . enoxaparin (LOVENOX) injection  40 mg Subcutaneous QHS  . isosorbide mononitrate  30 mg Oral Daily  . metoprolol tartrate  12.5 mg Oral BID    Family History    Family History  Problem Relation Age of Onset  . Hypertension Mother       Social History    Social History   Social History  . Marital status: Unknown    Spouse name: N/A  . Number of children: N/A  . Years of education: N/A   Occupational History  . Not on file.   Social History Main Topics  . Smoking status: Former Smoker    Years: 10.00    Types: Cigarettes    Quit date: 08/12/1998  . Smokeless tobacco: Never Used  . Alcohol use No  . Drug use: No  . Sexual activity: Not on file   Other Topics Concern  . Not on file   Social History Narrative   Pt is in prison.  He does not routinely exercise.     Review of Systems    General:  No chills, fever, night sweats or weight changes.  Cardiovascular:  +++ chest pain, +++ dyspnea, no orthopnea, palpitations, paroxysmal nocturnal dyspnea. Dermatological: No rash, lesions/masses Respiratory: No cough, +++ dyspnea Urologic: No hematuria, dysuria Abdominal:   No nausea, vomiting, diarrhea, bright red blood per rectum, melena, or hematemesis Neurologic:  No visual changes, wkns, changes in mental status. All other systems reviewed and are otherwise negative except as noted above.  Physical Exam    Blood pressure (!) 98/48, pulse 75, temperature 98.4 F (36.9 C), temperature source Oral, resp. rate 17, height 5\' 11"  (1.803 m), weight 162 lb 14.7 oz (73.9 kg), SpO2 96 %.  General: Pleasant, NAD Psych: Normal affect. Neuro: Alert and oriented X 3. Moves all extremities spontaneously. HEENT: Normal  Neck: Supple without bruits or JVD. Lungs:  Resp regular and unlabored, CTA. Heart: RRR no s3, s4, or murmurs. Abdomen: Soft, non-tender, non-distended, BS + x 4.  Extremities: No clubbing, cyanosis or edema. DP/PT/Radials 2+ and equal bilaterally.  Labs    Troponin [facility](Point of Care Test)  Recent Labs  08/12/16 1525  TROPIPOC 0.00    Recent Labs  08/12/16 2206 08/13/16 0241  TROPONINI <0.03 <0.03   Lab Results  Component Value Date   WBC 4.6 08/12/2016   HGB 13.6 08/12/2016   HCT  40.1 08/12/2016   MCV 91.1 08/12/2016   PLT 129 (L) 08/12/2016     Recent Labs Lab 08/12/16 1450  NA 138  K 3.6  CL 107  CO2 23  BUN 10  CREATININE 1.05  CALCIUM 9.0  GLUCOSE 85   Lab Results  Component Value Date   CHOL 103 08/13/2016   HDL 46 08/13/2016   LDLCALC 46 08/13/2016   TRIG 55 08/13/2016    Radiology Studies    Dg Chest 2 View  Result Date: 08/12/2016 CLINICAL DATA:  Chest pain today EXAM: CHEST  2 VIEW COMPARISON:  None. FINDINGS: No active infiltrate or effusion is seen. Probable nipple shadow is noted at the right lung base. Mediastinal and hilar contours are unremarkable. The heart is within normal limits in size. An implanted loop recorder overlies the anterior left chest.  No bony abnormality is seen. IMPRESSION: No active lung disease.  Implanted loop recorder. Electronically Signed   By: Dwyane Dee M.D.   On: 08/12/2016 15:33   ECG & Cardiac Imaging    RSR, 65, RBBB  Assessment & Plan    1.  Unstable Angina/CAD:  Pt with prior h/o CAD s/p prior DES (presumed to be in 2003) of unknown vessel.  He developed c/p @ rest yesterday while in transit from one correctional facility to another that was partially nitrate responsive.  Symptoms resolved with morphine in the ED.  He has r/o but has cont to have intermittent chest pain, for which he has been getting morphine.  Currently he is pain free.  Given [place] with recurrent c/p during admission, we will plan on diagnostic cath. The patient understands that risks include but are not limited to stroke (1 in 1000), death (1 in 1000), kidney failure [usually temporary] (1 in 500), bleeding (1 in 200), allergic reaction [possibly serious] (1 in 200), and agrees to proceed.  Cont asa, plavix,  blocker, statin, nitrate.  2.  Essential HTN:  Stable on  blocker.  3.  HL:  LDL 46.  Cont statin rx.  Signed, Nicolasa Ducking, NP 08/13/2016, 3:26 PM   I have personally seen and examined this patient with Ward Givens, NP. I  agree with the assessment and plan as outlined above. He is known to have CAD with prior stenting in 2003. He has chest pain worrisome for unstable angina. No dyspnea. Troponin is negative. EKG without ischemic changes. Exam shows a thin WM in NAD. CV:RRR no murmurs. Lungs: clear bilaterally. Abd: soft, NT. Ext: no edema.  EKG: sinus, RBBB Labs reviewed Plan: Unstable angina: Plan cardiac cath with possible PCI today. Risks and benefits of procedure reviewed with pt.   Verne Carrow 08/13/2016 4:14 PM

## 2016-08-14 ENCOUNTER — Encounter (HOSPITAL_COMMUNITY): Payer: Self-pay | Admitting: Cardiology

## 2016-08-14 DIAGNOSIS — I200 Unstable angina: Secondary | ICD-10-CM

## 2016-08-14 DIAGNOSIS — R072 Precordial pain: Secondary | ICD-10-CM

## 2016-08-14 DIAGNOSIS — I25110 Atherosclerotic heart disease of native coronary artery with unstable angina pectoris: Secondary | ICD-10-CM

## 2016-08-14 DIAGNOSIS — I10 Essential (primary) hypertension: Secondary | ICD-10-CM | POA: Diagnosis not present

## 2016-08-14 DIAGNOSIS — R079 Chest pain, unspecified: Secondary | ICD-10-CM | POA: Diagnosis not present

## 2016-08-14 LAB — HEMOGLOBIN A1C
Hgb A1c MFr Bld: 5.6 % (ref 4.8–5.6)
MEAN PLASMA GLUCOSE: 114 mg/dL

## 2016-08-14 MED FILL — Verapamil HCl IV Soln 2.5 MG/ML: INTRAVENOUS | Qty: 2 | Status: AC

## 2016-08-14 NOTE — Discharge Instructions (Signed)
Your admitted for chest pain. Cardiology evaluated at your heart and did not find anything concerning. Source of your chest pain seems to be noncardiac. Recommendations were for you to stop Plavix and Imdur. You can continue your statin and aspirin daily. Please stop your Toprol-XL for the next few days until you are reevaluated by your PCP.

## 2016-08-14 NOTE — Progress Notes (Signed)
     SUBJECTIVE: No chest pain.   Tele: sinus  BP (!) 94/53 (BP Location: Right Arm)   Pulse (!) 51   Temp 98.6 F (37 C) (Oral)   Resp 16   Ht 5\' 11"  (1.803 m)   Wt 162 lb 14.7 oz (73.9 kg)   SpO2 95%   BMI 22.72 kg/m   Intake/Output Summary (Last 24 hours) at 08/14/16 0755 Last data filed at 08/13/16 1456  Gross per 24 hour  Intake                0 ml  Output              900 ml  Net             -900 ml    PHYSICAL EXAM General: Well developed, well nourished, in no acute distress. Alert and oriented x 3.  Psych:  Good affect, responds appropriately Neck: No JVD. No masses noted.  Lungs: Clear bilaterally with no wheezes or rhonci noted.  Heart: RRR with no murmurs noted. Abdomen: Bowel sounds are present. Soft, non-tender.  Extremities: No lower extremity edema.   LABS: Basic Metabolic Panel:  Recent Labs  16/04/9601/06/18 1450  NA 138  K 3.6  CL 107  CO2 23  GLUCOSE 85  BUN 10  CREATININE 1.05  CALCIUM 9.0   CBC:  Recent Labs  08/12/16 1450  WBC 4.6  HGB 13.6  HCT 40.1  MCV 91.1  PLT 129*   Cardiac Enzymes:  Recent Labs  08/12/16 2206 08/13/16 0241  TROPONINI <0.03 <0.03   Fasting Lipid Panel:  Recent Labs  08/13/16 0241  CHOL 103  HDL 46  LDLCALC 46  TRIG 55  CHOLHDL 2.2    Current Meds: . aspirin EC  81 mg Oral Daily  . atorvastatin  20 mg Oral q1800  . enoxaparin (LOVENOX) injection  40 mg Subcutaneous QHS  . sodium chloride flush  3 mL Intravenous Q12H  . sodium chloride flush  3 mL Intravenous Q12H     ASSESSMENT AND PLAN:  1. Chest pain: His chest pain is non-cardiac. Cardiac cath 08/13/16 with no evidence of CAD or stents as the patient had described.  LV function is normal. OK to discharge.   Verne Carrowhristopher McAlhany  2/8/20187:55 AM

## 2016-08-14 NOTE — Progress Notes (Signed)
Family Medicine Teaching Service Daily Progress Note Intern Pager: [phone]  Patient name: Terrance MunchRoger Fotopoulos Medical record number: [id number] Date of birth: 08-Nov-1965 Age: 51 y.o. Gender: male  Primary Care Provider: Ron ParkerJENKINS,HARVETTE C, MD Consultants: None Code Status: Full  Pt Overview and Major Events to Date:  02/06: Admit for ACS r/o 02/07: Cards consulted, LHC clean  Assessment and Plan: Terrance MunchRoger Rail is a 51 y.o. male presenting for ACS rule-out. PMH is significant for HLD, CAD (stent x3 1996, on Plavix+ASA), HTN, and former tobacco abuse.   #Non-cardiac chest pain: Acute, resolved. HEART score of 6. Troponin negative x3. Responds well to IV morphine. CP intermittent and radiates to left neck and arm, not reproducible on exam. Initially associated with SOB and diaphoresis. CXR negative for infiltrate or effusion; loop recorder present. No prior records of last work-up available. EKG sinus tachycardia w/o ST changes, but no prior EKG available. TSH wnl. Lipid panel wnl. MSK pain not appreciated. Echocardiogram EF 60-65%, no wall motion abnormality. LHC clean with recommendations to discontinue Plavix and Imdur per cardiology. --Cardiology consulted, appreciate recommendations --Cardiac monitoring --Continuous pulse ox --Tylenol PRN mild chest pain --IV morphine 2 mg q2h PRN severe chest pain --ASA 81 mg QD --Will discontinue Plavix 75 mg QD --Zofran PRN  #HLD: Chronic, stable. Lipid panel wnl. On statin therapy. ASCVD score 2.8%. --Lipitor 20 mg QD  #HTN: Chronic, stable. Well controlled on admission. BP 90s/50s post catheterization. Holding Lopressor at present. --Will discontinue Imdur 30 mg QD --Switching Toprol-XL 25 mg QD to Lopressor 12.5 mg BID  FEN/GI: Heart healthy diet Prophylaxis: Lovenox SQ  Disposition: Clean cath yesterday. Likely d/c today.  Subjective:  Patient overall feels well today without chest pain or shortness of breath. Feels ready to go. No  concerns or questions today. Says he is surprised he has no stents.  Objective: Temp:  [98.3 F (36.8 C)-98.7 F (37.1 C)] 98.6 F (37 C) (02/08 0521) Pulse Rate:  [0-75] 51 (02/08 0521) Resp:  [0-38] 16 (02/08 0521) BP: (85-100)/(48-70) 94/53 (02/08 0521) SpO2:  [0 %-100 %] 95 % (02/08 0521) Physical Exam: General: well nourished, well developed, in no acute distress with non-toxic appearance HEENT: normocephalic, atraumatic, moist mucous membranes Neck: supple, non-tender without lymphadenopathy, no JVD CV: regular rate and rhythm without murmurs, rubs, or gallops Lungs: clear to auscultation bilaterally with normal work of breathing Abdomen: soft, non-tender, no masses or organomegaly palpable, normoactive bowel sounds Skin: warm, dry, no rashes or lesions, cap refill < 2 seconds Extremities: warm and well perfused, normal tone  Laboratory:  Recent Labs Lab 08/12/16 1450  WBC 4.6  HGB 13.6  HCT 40.1  PLT 129*    Recent Labs Lab 08/12/16 1450  NA 138  K 3.6  CL 107  CO2 23  BUN 10  CREATININE 1.05  CALCIUM 9.0  GLUCOSE 85   Lipid Panel     Component Value Date/Time   CHOL 103 08/13/2016 0241   TRIG 55 08/13/2016 0241   HDL 46 08/13/2016 0241   CHOLHDL 2.2 08/13/2016 0241   VLDL 11 08/13/2016 0241   LDLCALC 46 08/13/2016 0241   --I-STAT troponin: Negative --Troponin-I: Negative x2 --TSH: 2.304 (WNL) --A1c: 5.6 (WNL)  Imaging/Diagnostic Tests: Left Heart Cath and Coronary Angiography (08/13/2016) CONCLUSION: Angiographically normal coronary arteries with normal EF. Arterial hypotension. No evidence of previously placed Stents. Non-anginal chest pain with no evidence of stents that have been reported. Patient has an implantable loop recorder.  PLAN: Standard post cath care. Look  for nonanginal etiology for chest pain. DC Plavix and Imdur.  Transthoracic Echocardiography (08/13/2016) CONCLUSIONS: Left ventricle: The cavity size was normal. There was  mild concentric hypertrophy. Systolic function was normal. The estimated ejection fraction was in the range of 60% to 65%. Wall motion was normal; there were no regional wall motion abnormalities. Left ventricular diastolic function parameters were normal.  Aortic valve: Transvalvular velocity was within the normal range. There was no stenosis. There was no regurgitation.  Mitral valve: Transvalvular velocity was within the normal range. There was no evidence for stenosis. There was trivial regurgitation.  Right ventricle: The cavity size was normal. Wall thickness was normal. Systolic function was normal.  Atrial septum: No defect or patent foramen ovale was identified by color flow Doppler.  Tricuspid valve: There was no regurgitation.  DG Chest 2 View (08/12/2016) FINDINGS: No active infiltrate or effusion is seen. Probable nipple shadow is noted at the right lung base. Mediastinal and hilar contours are unremarkable. The heart is within normal limits in size. An implanted loop recorder overlies the anterior left chest. No bony abnormality is seen.  IMPRESSION: No active lung disease.  Implanted loop recorder.    Wendee Beavers, DO 08/14/2016, 6:51 AM PGY-1, [facility] FPTS Intern pager: [phone], text pages welcome

## 2016-08-14 NOTE — Progress Notes (Addendum)
08/14/2016 1:33 PM Discharge AVS meds taken today and those due this evening reviewed with pt and guards.  Nurses name and number placed on AVS for nurse at facility for any questions.  Follow-up appointments and when to call md reviewed.  D/C IV and TELE.  Questions and concerns addressed.   D/C back to facility with guards per orders. Kathryne HitchAllen, Taeja Debellis C

## 2018-02-09 DIAGNOSIS — R079 Chest pain, unspecified: Secondary | ICD-10-CM

## 2018-02-09 IMAGING — DX DG CHEST 2V
2 series · 2 of 2 positions shown · non-contrast
Comparison: None.

CLINICAL DATA: Chest pain today

EXAM:
CHEST  2 VIEW

[x chest ap]
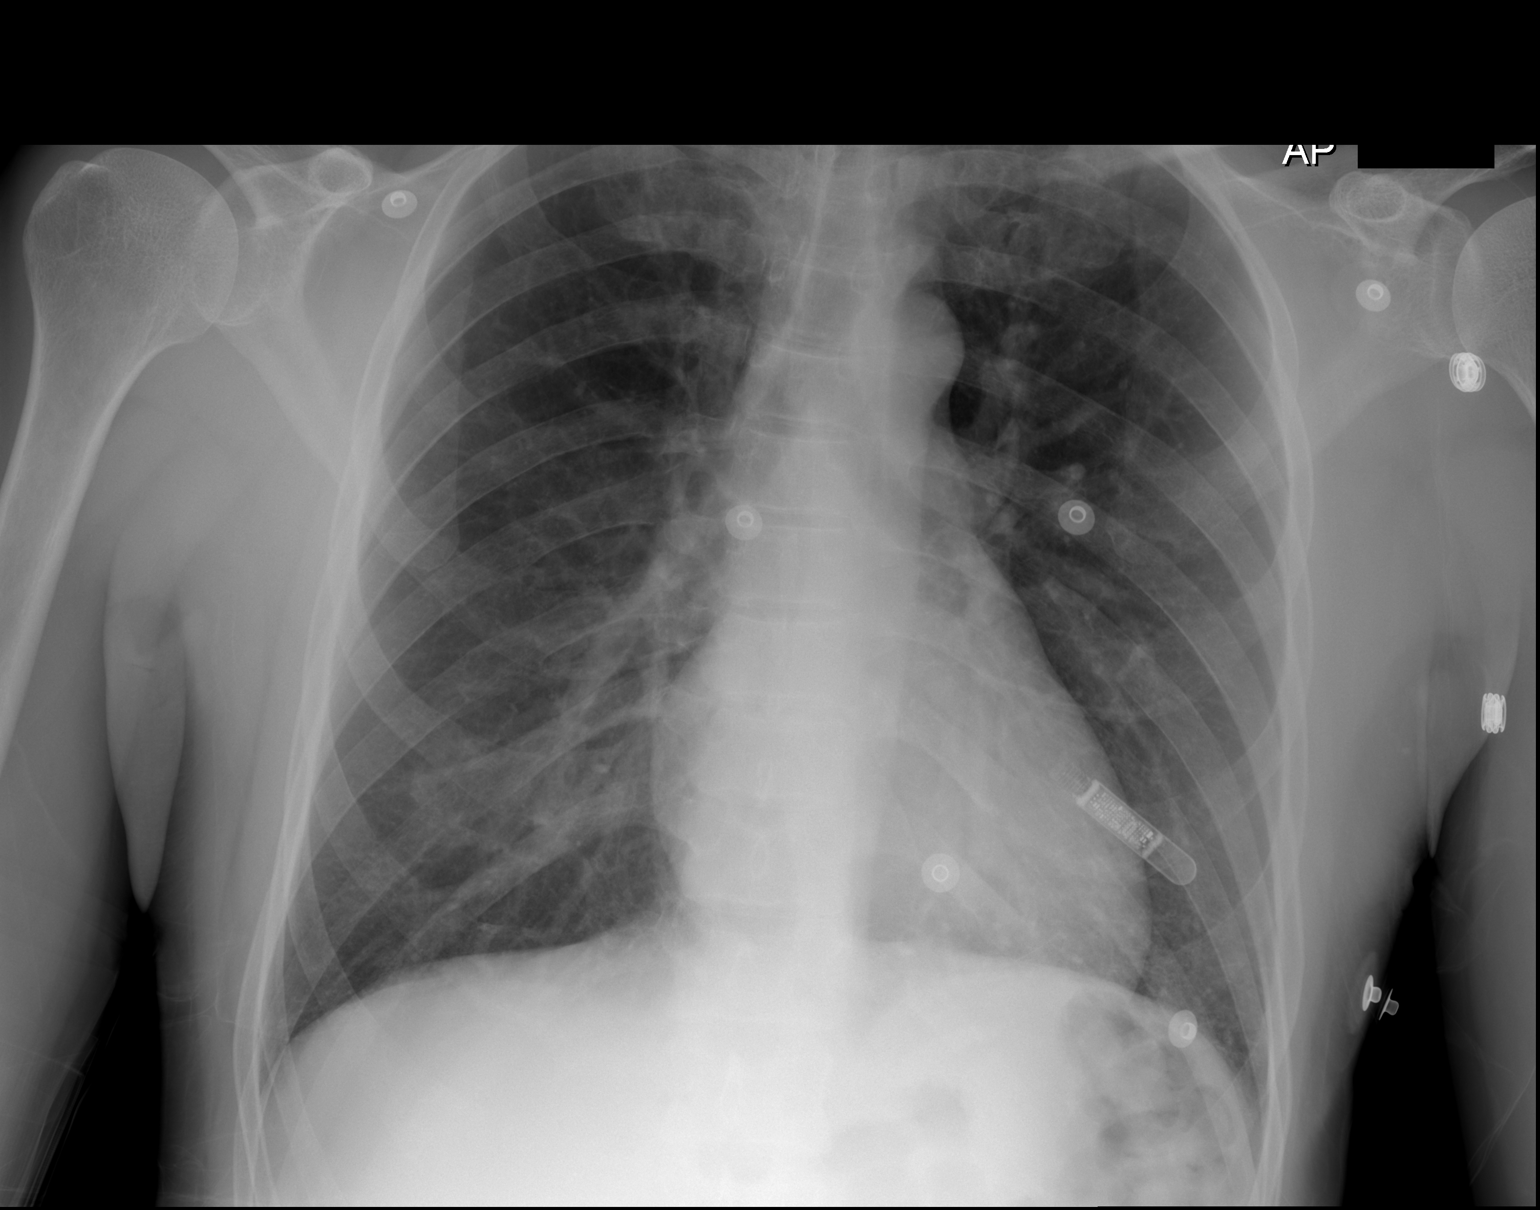

[w chest lat]
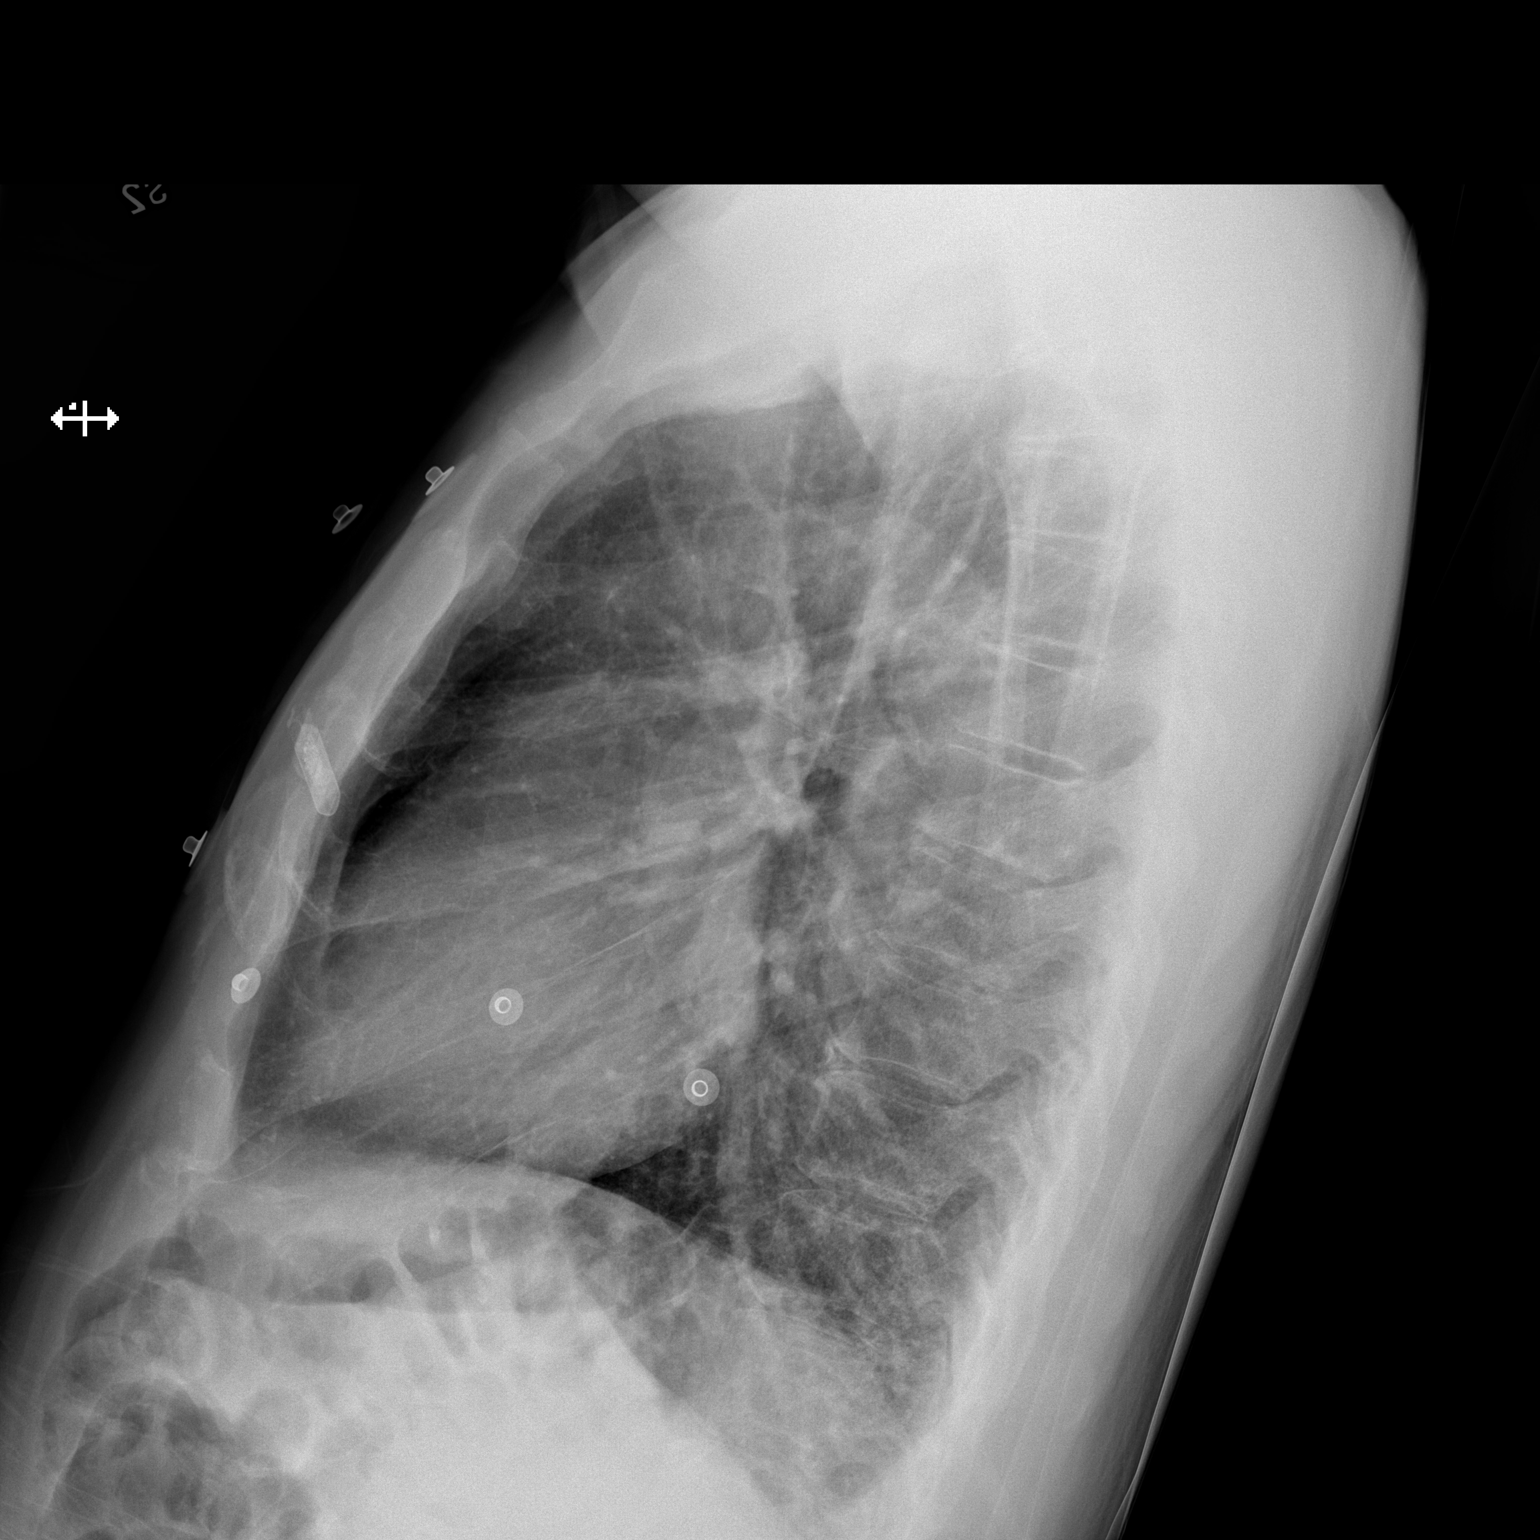

[2 of 2 positions shown; findings below may reference images not displayed]

FINDINGS: No active infiltrate or effusion is seen. Probable nipple shadow is
noted at the right lung base. Mediastinal and hilar contours are
unremarkable. The heart is within normal limits in size. An
implanted loop recorder overlies the anterior left chest. No bony
abnormality is seen.
IMPRESSION: No active lung disease.  Implanted loop recorder.

## 2018-02-09 NOTE — ED Notes (Signed)
C/o L sided chest pain radiating down L arm, +nausea, +sob, started approximately 30 minutes ago.  Hx of MI, states feels the same.

## 2018-02-09 NOTE — ED Notes (Signed)
Pt resting on stretcher at this time. No acute distress at this time. Pt remains on ccm and sp02 on room air. Call light with in reach.

## 2018-02-09 NOTE — ED Notes (Signed)
Patient rounded on at this time. RN reassessed patient's pain at this time. Pt declined the need to use the potty but states that they will make RN aware if the need arises. Pt belongings (including phone) within reach at this time. Patient reports that they are in a position of comfort at this time. Call light is within reach. Bed in low position. Side rails up x2. Wheels locked. RN made patient aware that they will be updated on plan of care as it evolves and staff will be back within the hour to check on them.

## 2018-02-09 NOTE — ED Notes (Signed)
Pt resting on stretcher at this time. No acute distress at this time. Pt remains on ccm and sp02 on room air. Call light with in reach.

## 2018-02-09 NOTE — ED Triage Notes (Signed)
C/o L sided chest pain radiating down L arm, +nausea, +sob, started approximately 30 minutes ago.  Hx of MI, states feels the same.

## 2018-02-10 ENCOUNTER — Inpatient Hospital Stay
Admit: 2018-02-10 | Discharge: 2018-02-11 | Disposition: A | Discharge status: Home / Self Care | Payer: MEDICARE | Attending: Emergency Medicine

## 2018-02-10 ENCOUNTER — Emergency Department: Admit: 2018-02-10 | Payer: MEDICARE | Primary: Family Medicine

## 2018-02-10 ENCOUNTER — Inpatient Hospital Stay
Admit: 2018-02-10 | Discharge: 2018-02-10 | Disposition: A | Discharge status: Home / Self Care | Payer: MEDICARE | Attending: Emergency Medicine

## 2018-02-10 DIAGNOSIS — R079 Chest pain, unspecified: Secondary | ICD-10-CM

## 2018-02-10 LAB — CBC WITH AUTOMATED DIFF
ABS. BASOPHILS: 0.1 10*3/uL (ref 0.0–0.1)
ABS. BASOPHILS: 0.1 10*3/uL (ref 0.0–0.1)
ABS. EOSINOPHILS: 0.3 10*3/uL (ref 0.0–0.4)
ABS. EOSINOPHILS: 0.1 10*3/uL (ref 0.0–0.4)
ABS. IMM. GRANS.: 0 10*3/uL (ref 0.00–0.04)
ABS. IMM. GRANS.: 0 10*3/uL (ref 0.00–0.04)
ABS. LYMPHOCYTES: 0.8 10*3/uL (ref 0.8–3.5)
ABS. LYMPHOCYTES: 1 10*3/uL (ref 0.8–3.5)
ABS. MONOCYTES: 0.8 10*3/uL (ref 0.0–1.0)
ABS. MONOCYTES: 0.9 10*3/uL (ref 0.0–1.0)
ABS. NEUTROPHILS: 4.6 10*3/uL (ref 1.8–8.0)
ABS. NEUTROPHILS: 5.1 10*3/uL (ref 1.8–8.0)
ABSOLUTE NRBC: 0 10*3/uL (ref 0.00–0.01)
ABSOLUTE NRBC: 0 10*3/uL (ref 0.00–0.01)
BASOPHILS: 1 % (ref 0–1)
BASOPHILS: 1 % (ref 0–1)
EOSINOPHILS: 4 % (ref 0–7)
EOSINOPHILS: 2 % (ref 0–7)
HCT: 40.8 % (ref 36.6–50.3)
HCT: 41.9 % (ref 36.6–50.3)
HGB: 13.4 g/dL (ref 12.1–17.0)
HGB: 13.2 g/dL (ref 12.1–17.0)
IMMATURE GRANULOCYTES: 1 % — ABNORMAL HIGH (ref 0.0–0.5)
IMMATURE GRANULOCYTES: 0 % (ref 0.0–0.5)
LYMPHOCYTES: 12 % (ref 12–49)
LYMPHOCYTES: 14 % (ref 12–49)
MCH: 31.2 PG (ref 26.0–34.0)
MCH: 31 PG (ref 26.0–34.0)
MCHC: 32.8 g/dL (ref 30.0–36.5)
MCHC: 31.5 g/dL (ref 30.0–36.5)
MCV: 94.9 FL (ref 80.0–99.0)
MCV: 98.4 FL (ref 80.0–99.0)
MONOCYTES: 12 % (ref 5–13)
MONOCYTES: 13 % (ref 5–13)
MPV: 9.5 FL (ref 8.9–12.9)
MPV: 9.4 FL (ref 8.9–12.9)
NEUTROPHILS: 70 % (ref 32–75)
NEUTROPHILS: 70 % (ref 32–75)
NRBC: 0 PER 100 WBC
NRBC: 0 PER 100 WBC
PLATELET: 219 10*3/uL (ref 150–400)
PLATELET: 214 10*3/uL (ref 150–400)
RBC: 4.3 M/uL (ref 4.10–5.70)
RBC: 4.26 M/uL (ref 4.10–5.70)
RDW: 13.4 % (ref 11.5–14.5)
RDW: 13.6 % (ref 11.5–14.5)
WBC: 6.5 10*3/uL (ref 4.1–11.1)
WBC: 7.3 10*3/uL (ref 4.1–11.1)

## 2018-02-10 LAB — METABOLIC PANEL, COMPREHENSIVE
A-G Ratio: 1 — ABNORMAL LOW (ref 1.1–2.2)
ALT (SGPT): 49 U/L (ref 12–78)
AST (SGOT): 27 U/L (ref 15–37)
Albumin: 4 g/dL (ref 3.5–5.0)
Alk. phosphatase: 81 U/L (ref 45–117)
Anion gap: 11 mmol/L (ref 5–15)
BUN/Creatinine ratio: 13 (ref 12–20)
BUN: 17 MG/DL (ref 6–20)
Bilirubin, total: 0.4 MG/DL (ref 0.2–1.0)
CO2: 26 mmol/L (ref 21–32)
Calcium: 9.2 MG/DL (ref 8.5–10.1)
Chloride: 103 mmol/L (ref 97–108)
Creatinine: 1.3 MG/DL (ref 0.70–1.30)
GFR est AA: >60 mL/min/{1.73_m2} (ref >60)
GFR est non-AA: 58 mL/min/{1.73_m2} — ABNORMAL LOW (ref >60)
Globulin: 3.9 g/dL (ref 2.0–4.0)
Glucose: 100 mg/dL (ref 65–100)
Potassium: 4.2 mmol/L (ref 3.5–5.1)
Protein, total: 7.9 g/dL (ref 6.4–8.2)
Sodium: 140 mmol/L (ref 136–145)

## 2018-02-10 LAB — EKG, 12 LEAD, INITIAL
Atrial Rate: 81 {beats}/min
Calculated P Axis: 33 degrees
Calculated R Axis: 82 degrees
Calculated T Axis: 45 degrees
Diagnosis: NORMAL
P-R Interval: 126 ms
Q-T Interval: 378 ms
QRS Duration: 120 ms
QTC Calculation (Bezet): 439 ms
Ventricular Rate: 81 {beats}/min

## 2018-02-10 LAB — METABOLIC PANEL, BASIC
Anion gap: 6 mmol/L (ref 5–15)
BUN/Creatinine ratio: 15 (ref 12–20)
BUN: 17 MG/DL (ref 6–20)
CO2: 26 mmol/L (ref 21–32)
Calcium: 9 MG/DL (ref 8.5–10.1)
Chloride: 107 mmol/L (ref 97–108)
Creatinine: 1.17 MG/DL (ref 0.70–1.30)
GFR est AA: >60 mL/min/{1.73_m2} (ref >60)
GFR est non-AA: >60 mL/min/{1.73_m2} (ref >60)
Glucose: 90 mg/dL (ref 65–100)
Potassium: 3.9 mmol/L (ref 3.5–5.1)
Sodium: 139 mmol/L (ref 136–145)

## 2018-02-10 LAB — SAMPLES BEING HELD: SAMPLES BEING HELD: 1

## 2018-02-10 LAB — TROPONIN I
Troponin-I, Qt.: <0.05 ng/mL (ref <0.05)
Troponin-I, Qt.: <0.05 ng/mL (ref <0.05)
Troponin-I, Qt.: <0.05 ng/mL (ref <0.05)

## 2018-02-10 LAB — BASIC METABOLIC PANEL
Anion Gap: 6 mmol/L (ref 5–15)
BUN: 17 MG/DL (ref 6–20)
Bun/Cre Ratio: 15 (ref 12–20)
CO2: 26 mmol/L (ref 21–32)
Calcium: 9 MG/DL (ref 8.5–10.1)
Chloride: 107 mmol/L (ref 97–108)
Creatinine: 1.17 MG/DL (ref 0.70–1.30)
EGFR IF NonAfrican American: >60 mL/min/{1.73_m2} (ref >60)
GFR African American: >60 mL/min/{1.73_m2} (ref >60)
Glucose: 90 mg/dL (ref 65–100)
Potassium: 3.9 mmol/L (ref 3.5–5.1)
Sodium: 139 mmol/L (ref 136–145)

## 2018-02-10 LAB — CBC WITH AUTO DIFFERENTIAL
Basophils %: 1 % (ref 0–1)
Basophils %: 1 % (ref 0–1)
Basophils Absolute: 0.1 10*3/uL (ref 0.0–0.1)
Basophils Absolute: 0.1 10*3/uL (ref 0.0–0.1)
Eosinophils %: 4 % (ref 0–7)
Eosinophils %: 2 % (ref 0–7)
Eosinophils Absolute: 0.3 10*3/uL (ref 0.0–0.4)
Eosinophils Absolute: 0.1 10*3/uL (ref 0.0–0.4)
Granulocyte Absolute Count: 0 10*3/uL (ref 0.00–0.04)
Granulocyte Absolute Count: 0 10*3/uL (ref 0.00–0.04)
Hematocrit: 40.8 % (ref 36.6–50.3)
Hematocrit: 41.9 % (ref 36.6–50.3)
Hemoglobin: 13.4 g/dL (ref 12.1–17.0)
Hemoglobin: 13.2 g/dL (ref 12.1–17.0)
Immature Granulocytes: 1 % — ABNORMAL HIGH (ref 0.0–0.5)
Immature Granulocytes: 0 % (ref 0.0–0.5)
Lymphocytes %: 12 % (ref 12–49)
Lymphocytes %: 14 % (ref 12–49)
Lymphocytes Absolute: 0.8 10*3/uL (ref 0.8–3.5)
Lymphocytes Absolute: 1 10*3/uL (ref 0.8–3.5)
MCH: 31.2 PG (ref 26.0–34.0)
MCH: 31 PG (ref 26.0–34.0)
MCHC: 32.8 g/dL (ref 30.0–36.5)
MCHC: 31.5 g/dL (ref 30.0–36.5)
MCV: 94.9 FL (ref 80.0–99.0)
MCV: 98.4 FL (ref 80.0–99.0)
MPV: 9.5 FL (ref 8.9–12.9)
MPV: 9.4 FL (ref 8.9–12.9)
Monocytes %: 12 % (ref 5–13)
Monocytes %: 13 % (ref 5–13)
Monocytes Absolute: 0.8 10*3/uL (ref 0.0–1.0)
Monocytes Absolute: 0.9 10*3/uL (ref 0.0–1.0)
NRBC Absolute: 0 10*3/uL (ref 0.00–0.01)
NRBC Absolute: 0 10*3/uL (ref 0.00–0.01)
Neutrophils %: 70 % (ref 32–75)
Neutrophils %: 70 % (ref 32–75)
Neutrophils Absolute: 4.6 10*3/uL (ref 1.8–8.0)
Neutrophils Absolute: 5.1 10*3/uL (ref 1.8–8.0)
Nucleated RBCs: 0 PER 100 WBC
Nucleated RBCs: 0 PER 100 WBC
Platelets: 219 10*3/uL (ref 150–400)
Platelets: 214 10*3/uL (ref 150–400)
RBC: 4.3 M/uL (ref 4.10–5.70)
RBC: 4.26 M/uL (ref 4.10–5.70)
RDW: 13.4 % (ref 11.5–14.5)
RDW: 13.6 % (ref 11.5–14.5)
WBC: 6.5 10*3/uL (ref 4.1–11.1)
WBC: 7.3 10*3/uL (ref 4.1–11.1)

## 2018-02-10 LAB — COMPREHENSIVE METABOLIC PANEL
ALT: 49 U/L (ref 12–78)
AST: 27 U/L (ref 15–37)
Albumin/Globulin Ratio: 1 — ABNORMAL LOW (ref 1.1–2.2)
Albumin: 4 g/dL (ref 3.5–5.0)
Alkaline Phosphatase: 81 U/L (ref 45–117)
Anion Gap: 11 mmol/L (ref 5–15)
BUN: 17 MG/DL (ref 6–20)
Bun/Cre Ratio: 13 (ref 12–20)
CO2: 26 mmol/L (ref 21–32)
Calcium: 9.2 MG/DL (ref 8.5–10.1)
Chloride: 103 mmol/L (ref 97–108)
Creatinine: 1.3 MG/DL (ref 0.70–1.30)
EGFR IF NonAfrican American: 58 mL/min/{1.73_m2} — ABNORMAL LOW (ref >60)
GFR African American: >60 mL/min/{1.73_m2} (ref >60)
Globulin: 3.9 g/dL (ref 2.0–4.0)
Glucose: 100 mg/dL (ref 65–100)
Potassium: 4.2 mmol/L (ref 3.5–5.1)
Sodium: 140 mmol/L (ref 136–145)
Total Bilirubin: 0.4 MG/DL (ref 0.2–1.0)
Total Protein: 7.9 g/dL (ref 6.4–8.2)

## 2018-02-10 LAB — EKG 12-LEAD
Atrial Rate: 81 {beats}/min
Diagnosis: NORMAL
P Axis: 33 degrees
P-R Interval: 126 ms
Q-T Interval: 378 ms
QRS Duration: 120 ms
QTc Calculation (Bazett): 439 ms
R Axis: 82 degrees
T Axis: 45 degrees
Ventricular Rate: 81 {beats}/min

## 2018-02-10 LAB — TROPONIN
Troponin I: <0.05 ng/mL (ref <0.05)
Troponin I: <0.05 ng/mL (ref <0.05)
Troponin I: <0.05 ng/mL (ref <0.05)

## 2018-02-10 MED ORDER — KETOROLAC TROMETHAMINE 30 MG/ML INJECTION
30 mg/mL (1 mL) | INTRAMUSCULAR | Status: AC
Start: 2018-02-10 — End: 2018-02-10
  Administered 2018-02-10: 23:00:00 via INTRAVENOUS

## 2018-02-10 MED ORDER — NITROGLYCERIN 2 % TRANSDERMAL OINTMENT
2 % | TRANSDERMAL | Status: AC
Start: 2018-02-10 — End: 2018-02-09
  Administered 2018-02-10: 01:00:00 via TOPICAL

## 2018-02-10 MED FILL — NITRO-BID 2 % TRANSDERMAL OINTMENT: 2 % | TRANSDERMAL | Qty: 1

## 2018-02-10 MED FILL — KETOROLAC TROMETHAMINE 30 MG/ML INJECTION: 30 mg/mL (1 mL) | INTRAMUSCULAR | Qty: 1

## 2018-02-10 NOTE — ED Notes (Signed)
I have reviewed discharge instructions with the patient.  The patient verbalized understanding. VSS, respirations even and unlabored and in no acute distress. Ambulated out of the department with a steady gait.

## 2018-02-10 NOTE — ED Notes (Signed)
Assumed care of patient at this time. Patient sitting comfortably in stretcher watching TV.  Patient currently denies any chest pain.   VSS and in no acute distress.

## 2018-02-10 NOTE — ED Provider Notes (Signed)
52 y.o. male with past medical history significant for MI, hypertension, high cholesterol, hyperlipidemia, and mood disorder who presents from home via EMS with chief complaint of CP.  Pt states ~1 hour ago he started to have CP that radiated into his neck and down his left arm. Pt states earlier today he felt normal. Pt states movement does not alleviate or exacerbate his CP. Pt states his symptoms feel like they did during his last MI. Pt states he took 3 Nitro's at onset of CP. Pt states he also has accompanying nausea, diaphoresis, light headedness and SOB. Pt states he has 3 stents in his right coronary artery. Pt states he takes Plavix, Metoprolol, Aspirin and Lipitor. Pt states he has a defibrillator. Pt states he had an episode of syncope today when visiting his son at [place]. Pt denies vomiting, headache and fever. There are no other acute medical concerns at this time.    Old Chart Review: On December 18, 2017 pt was admitted to [facility] and had a negative cardiac cath. After being discharged from [facility] he went to [facility], where he was evaluated and discharged the same day. The next day, June 16th, pt went to [facility] again, was discharged and again went back to [facility]. He was then discharged again the same day. More recently, patient was seen on July 30 at [facility] for chest pain.  He was seen on August 2 at [facility] for chest pain.  He was seen at [facility] health systems on August 4 for chest pain. Pt was seen yesterday in the [facility] ED for chest pain. Pt stated he had pain that radiated up his neck and down his left arm.  Pt stated he had taken nitroglycerin x3 for pain and took his daily aspirin. Pt stated he is followed by a cardiologist in [place], but cannot recall the name of the cardiologist.  Pt stated his last cardiac echo was in March.  Pt denied having any recent chest pain or evaluations for chest pain and though he has had 2 stress tests in the last 3  months, he denied recent stress testing.    Social hx: No tobacco use (Quit 16 years ago), No EtOH use    Cardiologist: Dr. Meredith Mody    Note written by Lennox Pippins. Hudalla, Scribe, as dictated by Carolyn Stare, MD 6:08 PM            The history is provided by the patient. No language interpreter was used.        Past Medical History:   Diagnosis Date   ??? Hyperlipemia    ??? Hypertension    ??? MI (myocardial infarction) (HCC)    ??? Mood disorder (HCC) 01/12/2014       History reviewed. No pertinent surgical history.      History reviewed. No pertinent family history.    Social History     Socioeconomic History   ??? Marital status: SINGLE     Spouse name: Not on file   ??? Number of children: Not on file   ??? Years of education: Not on file   ??? Highest education level: Not on file   Occupational History   ??? Not on file   Social Needs   ??? Financial resource strain: Not on file   ??? Food insecurity:     Worry: Not on file     Inability: Not on file   ??? Transportation needs:  Medical: Not on file     Non-medical: Not on file   Tobacco Use   ??? Smoking status: Former Smoker   ??? Smokeless tobacco: Never Used   Substance and Sexual Activity   ??? Alcohol use: No     Alcohol/week: 0.0 standard drinks   ??? Drug use: No   ??? Sexual activity: Not on file   Lifestyle   ??? Physical activity:     Days per week: Not on file     Minutes per session: Not on file   ??? Stress: Not on file   Relationships   ??? Social connections:     Talks on phone: Not on file     Gets together: Not on file     Attends religious service: Not on file     Active member of club or organization: Not on file     Attends meetings of clubs or organizations: Not on file     Relationship status: Not on file   ??? Intimate partner violence:     Fear of current or ex partner: Not on file     Emotionally abused: Not on file     Physically abused: Not on file     Forced sexual activity: Not on file   Other Topics Concern   ??? Not on file   Social History Narrative   ??? Not  on file         ALLERGIES: Pcn [penicillins]    Review of Systems   Constitutional: Positive for diaphoresis. Negative for chills and fever.   Respiratory: Positive for shortness of breath.    Cardiovascular: Positive for chest pain.   Gastrointestinal: Positive for nausea. Negative for abdominal pain, constipation, diarrhea and vomiting.   Neurological: Positive for syncope and light-headedness. Negative for dizziness and headaches.   All other systems reviewed and are negative.      Vitals:    02/10/18 1806   BP: 120/78   Pulse: 71   Resp: 18   Temp: 97.6 ??F (36.4 ??C)   SpO2: 100%   Weight: 72.6 kg (160 lb)   Height: 5\' 11"  (1.803 m)            Physical Exam   Constitutional: He appears well-developed and well-nourished. No distress.   HENT:   Head: Normocephalic and atraumatic.   Eyes: Pupils are equal, round, and reactive to light. Conjunctivae are normal.   Neck: Normal range of motion.   Cardiovascular: Normal rate and regular rhythm.   Pulmonary/Chest: Effort normal and breath sounds normal.   Abdominal: Soft. He exhibits no distension. There is no tenderness.   Musculoskeletal: Normal range of motion.   Neurological: He is alert.   Skin: Skin is dry. Capillary refill takes less than 2 seconds.   Nursing note and vitals reviewed.     Note written by Lennox Pippins. Hudalla, Scribe, as dictated by Carolyn Stare, MD 6:33 PM    MDM  Number of Diagnoses or Management Options  Chest pain, unspecified type:   Diagnosis management comments: The patient is resting comfortably and feels better, is alert and in no distress. The repeat examination is unremarkable and benign. The electrocardiogram shows no signs of acute ischemia and the history, exam, diagnostic testing and current condition do not suggest that this patient is having an acute myocardial infarction, significant arrhythmia, unstable angina, esophageal perforation, pulmonary embolism, aortic dissection, pneumothorax, severe pneumonia, sepsis or other  significant pathology that would warrant further testing, continued  ED treatment, admission, or cardiology or other specialist consultation at this point. The vital signs have been stable. The patient's condition is stable and appropriate for discharge. The patient will pursue further outpatient evaluation with the primary care physician, other designated physician or cardiologist. The patient and/or caregivers have expressed a clear and thorough understanding and agree to follow up as instructed.           Procedures  ED EKG interpretation:  Rhythm: normal sinus rhythm; and regular . Rate (approx.): 68 bpm; Axis: normal; ST/T wave: normal; Right bundle branch block.    Note written by Lennox PippinsAlicia M. Hudalla, Scribe, as dictated by Carolyn Starergill, Taelynn Mcelhannon A, MD 6:24 PM    9:04 PM  [facility]. Pt had a negative stress test on June 1st, 2019. On June 13th, 2019 pt had a negative CT for PE, a negative stress test, and a negative troponin. On June 14th, 2019 [facility] and an incomplete stress test. Pt was also found to have a normal cardiac cath with NO EVIDENCE OF CARDIAC STENTS OR OF CORONARY ARTERY DISEASE. Pt has found to be admitted multiple times previously complaining of syncope. On May 29th pt had a pacemaker placed as an outpatient due to sick sinus syndrome. The pt was then admitted on May 30th 2019. Pt was then seen again on June 3rd, 2019 and discharged, admitted again on June 4th, 2019. Pt came back again on June 7th, 2019. On all of these admissions and visits, there were no objective findings with the exception of a now resolved hematoma around his pacemaker site. These visits are independent of the [facility], and [facility].

## 2018-02-10 NOTE — ED Notes (Signed)
[facility]. Reports left sided that radiates down arm. Patient was seen last night for the same complaint. Patient states [facility] wouldn't see him without ID. Ambulatory to stretcher. EMS gave one nitro without relief of chest pani.

## 2018-02-10 NOTE — ED Provider Notes (Signed)
The patient presents to the emergency room with chief complaint of chest pain.  He began having chest pain approximately 30 minutes ago while watching TV.  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  The pain radiates up his neck and down his left arm.  He describes that the pain comes and goes.  He has taken nitroglycerin x3 for pain.  He also takes daily aspirin.  He denies any shortness of breath.  He does complain of some nausea and feeling clammy.  He reports having history of MI in 1996 with 3 stents.  He denies having any cardiac catheterization since that time.  He reports he is followed by a cardiologist in [place], but cannot recall the name of the cardiologist.  Last cardiac echo was in March.  He denies having any recent chest pain or evaluations for chest pain.  He reports that a friend drove him to the emergency room.    Review of records reveals that the patient was seen on July 30 at [facility] for chest pain.  He was seen on August 2 at [facility] for chest pain.  He was seen at [facility] on August 4 for chest pain.  Care everywhere shows that the patient had an unremarkable cardiac catheterization done at [facility] in 2016.  The note from [facility] from 12/2014 reports "After review the medical records I can see the patient has had multiple evaluations at different facilities. He was actually admitted in the [facility] system and had a clean catheterization yesterday . It was after that discharge that [facility] and [facility]. [facility] and went [facility] to the [facility] system today. After he left there he came to our ER for evaluation of the same symptoms. It appears from the medical records that he is reporting the same symptoms were ever he presents. It does not seem that his symptoms tonight are different than what he has been reporting at the previous visits."        The history is provided by  the patient.   Chest Pain (Angina)    Associated symptoms include diaphoresis and nausea. Pertinent negatives include no abdominal pain, no cough, no fever, no headaches, no shortness of breath, no vomiting and no weakness.        Past Medical History:   Diagnosis Date   ??? Hyperlipemia    ??? Hypertension    ??? MI (myocardial infarction) (HCC)    ??? Mood disorder (HCC) 01/12/2014       History reviewed. No pertinent surgical history.      History reviewed. No pertinent family history.    Social History     Socioeconomic History   ??? Marital status: SINGLE     Spouse name: Not on file   ??? Number of children: Not on file   ??? Years of education: Not on file   ??? Highest education level: Not on file   Occupational History   ??? Not on file   Social Needs   ??? Financial resource strain: Not on file   ??? Food insecurity:     Worry: Not on file     Inability: Not on file   ??? Transportation needs:     Medical: Not on file     Non-medical: Not on file   Tobacco Use   ??? Smoking status: Former Smoker   ??? Smokeless  tobacco: Never Used   Substance and Sexual Activity   ??? Alcohol use: No     Alcohol/week: 0.0 standard drinks   ??? Drug use: No   ??? Sexual activity: Not on file   Lifestyle   ??? Physical activity:     Days per week: Not on file     Minutes per session: Not on file   ??? Stress: Not on file   Relationships   ??? Social connections:     Talks on phone: Not on file     Gets together: Not on file     Attends religious service: Not on file     Active member of club or organization: Not on file     Attends meetings of clubs or organizations: Not on file     Relationship status: Not on file   ??? Intimate partner violence:     Fear of current or ex partner: Not on file     Emotionally abused: Not on file     Physically abused: Not on file     Forced sexual activity: Not on file   Other Topics Concern   ??? Not on file   Social History Narrative   ??? Not on file         ALLERGIES: Pcn [penicillins]    Review of Systems   Constitutional: Positive  for diaphoresis. Negative for appetite change and fever.   HENT: Negative for congestion, nosebleeds and sore throat.    Eyes: Negative for discharge and visual disturbance.   Respiratory: Negative for cough and shortness of breath.    Cardiovascular: Positive for chest pain.   Gastrointestinal: Positive for nausea. Negative for abdominal pain, diarrhea and vomiting.   Genitourinary: Negative for dysuria.   Musculoskeletal: Negative.    Skin: Negative for rash.   Neurological: Negative for weakness and headaches.   Hematological: Negative for adenopathy.   Psychiatric/Behavioral: Negative.    All other systems reviewed and are negative.      Vitals:    02/09/18 2345 02/09/18 2346 02/09/18 2353 02/10/18 0001   BP: 120/78   120/78   Pulse: 69 67 69 73   Resp: 14 16 18 20    Temp:    97.8 ??F (36.6 ??C)   SpO2:  94% 97% 97%            Physical Exam   Constitutional: He is oriented to person, place, and time. He appears well-developed and well-nourished.   HENT:   Head: Normocephalic and atraumatic.   Eyes: Conjunctivae are normal.   Neck: Normal range of motion. Neck supple.   Cardiovascular: Normal rate, regular rhythm and normal heart sounds.   Pulmonary/Chest: Effort normal and breath sounds normal.   Abdominal: Soft. Bowel sounds are normal.   Musculoskeletal: Normal range of motion.   Neurological: He is alert and oriented to person, place, and time.   Skin: Skin is warm and dry.   Psychiatric: He has a normal mood and affect.   Nursing note and vitals reviewed.       MDM       Procedures    ED EKG interpretation:  Rhythm: normal sinus rhythm; and regular . Rate (approx.): 80; Axis: normal; P wave: normal; QRS interval: RBBB; ST/T wave: no ST elevation. No significant change from 07/21/2014.    A/P:  1. Chest pain - non-ischemic EKG. Reports hx CAD. I am concerned as the patient did not disclose any recent ED visits when asked when in  fact he has had 3 in the past week. Recommend cardiology follow-up for further  evalaution.      Patient's results have been reviewed with them.  Patient and/or family have verbally conveyed their understanding and agreement of the patient's signs, symptoms, diagnosis, treatment and prognosis and additionally agree to follow up as recommended or return to the Emergency Room should their condition change prior to follow-up.  Discharge instructions have also been provided to the patient with some educational information regarding their diagnosis as well a list of reasons why they would want to return to the ER prior to their follow-up appointment should their condition change.

## 2018-02-10 NOTE — ED Notes (Signed)
Discharge instructions reviewed with pt and copy given by this RN. Pt educated on signs and symptoms of chest pain to monitor for at home and when to return to ED if symptoms worsen. Pt also educated to follow up with cardiology MD. Pt verbalized understanding of all education. Pt ambulatory from ED in no sign of distress or discomfort.

## 2018-02-10 NOTE — ED Notes (Signed)
Assumed care of patient at this time. Patient sitting comfortably in stretcher watching TV.  Patient currently denies any chest pain.   VSS and in no acute distress.

## 2018-02-10 NOTE — ED Provider Notes (Signed)
52 y.o. male with past medical history significant for MI, hypertension, high cholesterol, hyperlipidemia, and mood disorder who presents from home via EMS with chief complaint of CP.  Pt states ~1 hour ago he started to have CP that radiated into his neck and down his left arm. Pt states earlier today he felt normal. Pt states movement does not alleviate or exacerbate his CP. Pt states his symptoms feel like they did during his last MI. Pt states he took 3 Nitro's at onset of CP. Pt states he also has accompanying nausea, diaphoresis, light headedness and SOB. Pt states he has 3 stents in his right coronary artery. Pt states he takes Plavix, Metoprolol, Aspirin and Lipitor. Pt states he has a defibrillator. Pt states he had an episode of syncope today when visiting his [place] [employer]. Pt denies vomiting, headache and fever. There are no other acute medical concerns at this time.    Old Chart Review: On December 18, 2017 pt was [facility] and had a negative cardiac cath. After being [facility], where he was evaluated and discharged the same day. The next day, June 16th, pt went [facility], was discharged and again went back to [facility]. He was then discharged again the same day. More recently, patient was seen on July 30 at [facility] for chest pain.  He was seen on August 2 at [facility] for [facility].  [facility] on August 4 for chest pain. Pt [facility] chest pain. Pt stated he had pain that radiated up his neck and down his left arm.  Pt stated he had taken nitroglycerin x3 for pain and took his daily aspirin. Pt stated he [place], but cannot recall the name of the cardiologist.  Pt stated his last cardiac echo was in March.  Pt denied having any recent chest pain or evaluations for chest pain and  though he has had 2 stress tests in the last 3 months, he denied recent stress testing.    Social hx: No tobacco use (Quit 16 years ago), No EtOH use    Cardiologist: Dr. Meredith Modyhristine Browning    Note written by Lennox PippinsAlicia M. Hudalla, Scribe, as dictated by Carolyn Starergill, Nikkie Liming A, MD 6:08 PM            The history is provided by the patient. No language interpreter was used.        Past Medical History:   Diagnosis Date   ??? Hyperlipemia    ??? Hypertension    ??? MI (myocardial infarction) (HCC)    ??? Mood disorder (HCC) 01/12/2014       History reviewed. No pertinent surgical history.      History reviewed. No pertinent family history.    Social History     Socioeconomic History   ??? Marital status: SINGLE     Spouse name: Not on file   ??? Number of children: Not on file   ??? Years of education: Not on file   ??? Highest education level: Not on file   Occupational History   ??? Not on file   Social Needs   ??? Financial resource strain: Not on file   ??? Food insecurity:     Worry: Not on file     Inability: Not on file   ??? Transportation needs:  Medical: Not on file     Non-medical: Not on file   Tobacco Use   ??? Smoking status: Former Smoker   ??? Smokeless tobacco: Never Used   Substance and Sexual Activity   ??? Alcohol use: No     Alcohol/week: 0.0 standard drinks   ??? Drug use: No   ??? Sexual activity: Not on file   Lifestyle   ??? Physical activity:     Days per week: Not on file     Minutes per session: Not on file   ??? Stress: Not on file   Relationships   ??? Social connections:     Talks on phone: Not on file     Gets together: Not on file     Attends religious service: Not on file     Active member of club or organization: Not on file     Attends meetings of clubs or organizations: Not on file     Relationship status: Not on file   ??? Intimate partner violence:     Fear of current or ex partner: Not on file     Emotionally abused: Not on file     Physically abused: Not on file     Forced sexual activity: Not on file   Other Topics Concern    ??? Not on file   Social History Narrative   ??? Not on file         ALLERGIES: Pcn [penicillins]    Review of Systems   Constitutional: Positive for diaphoresis. Negative for chills and fever.   Respiratory: Positive for shortness of breath.    Cardiovascular: Positive for chest pain.   Gastrointestinal: Positive for nausea. Negative for abdominal pain, constipation, diarrhea and vomiting.   Neurological: Positive for syncope and light-headedness. Negative for dizziness and headaches.   All other systems reviewed and are negative.      Vitals:    02/10/18 1806   BP: 120/78   Pulse: 71   Resp: 18   Temp: 97.6 ??F (36.4 ??C)   SpO2: 100%   Weight: 72.6 kg (160 lb)   Height: 5\' 11"  (1.803 m)            Physical Exam   Constitutional: He appears well-developed and well-nourished. No distress.   HENT:   Head: Normocephalic and atraumatic.   Eyes: Pupils are equal, round, and reactive to light. Conjunctivae are normal.   Neck: Normal range of motion.   Cardiovascular: Normal rate and regular rhythm.   Pulmonary/Chest: Effort normal and breath sounds normal.   Abdominal: Soft. He exhibits no distension. There is no tenderness.   Musculoskeletal: Normal range of motion.   Neurological: He is alert.   Skin: Skin is dry. Capillary refill takes less than 2 seconds.   Nursing note and vitals reviewed.     Note written by Lennox Pippins. Hudalla, Scribe, as dictated by Carolyn Stare, MD 6:33 PM    MDM  Number of Diagnoses or Management Options  Chest pain, unspecified type:   Diagnosis management comments: The patient is resting comfortably and feels better, is alert and in no distress. The repeat examination is unremarkable and benign. The electrocardiogram shows no signs of acute ischemia and the history, exam, diagnostic testing and current condition do not suggest that this patient is having an acute myocardial infarction, significant arrhythmia, unstable angina, esophageal perforation, pulmonary  embolism, aortic dissection, pneumothorax, severe pneumonia, sepsis or other significant pathology that would warrant further testing, continued  ED treatment, admission, or cardiology or other specialist consultation at this point. The vital signs have been stable. The patient's condition is stable and appropriate for discharge. The patient will pursue further outpatient evaluation with the primary care physician, other designated physician or cardiologist. The patient and/or caregivers have expressed a clear and thorough understanding and agree to follow up as instructed.           Procedures  ED EKG interpretation:  Rhythm: normal sinus rhythm; and regular . Rate (approx.): 68 bpm; Axis: normal; ST/T wave: normal; Right bundle branch block.    Note written by Lennox PippinsAlicia M. Hudalla, Scribe, as dictated by Carolyn Starergill, Taelynn Mcelhannon A, MD 6:24 PM    9:04 PM  [facility] received [facility]. Pt had a negative stress test on June 1st, 2019. On June 13th, 2019 pt had a negative CT for PE, a negative stress test, and a negative troponin. On June 14th, 2019 [facility] and an incomplete stress test. Pt was also found to have a normal cardiac cath with NO EVIDENCE OF CARDIAC STENTS OR OF CORONARY ARTERY DISEASE. Pt has found to be admitted multiple times previously complaining of syncope. On May 29th pt had a pacemaker placed as an outpatient due to sick sinus syndrome. The pt was then admitted on May 30th 2019. Pt was then seen again on June 3rd, 2019 and discharged, admitted again on June 4th, 2019. Pt came back again on June 7th, 2019. On all of these admissions and visits, there were no objective findings with the exception of a now resolved hematoma around his pacemaker site. These visits are independent of the visits to [facility], [facility], and [facility].

## 2018-02-10 NOTE — ED Provider Notes (Signed)
The patient presents to the emergency room with chief complaint of chest pain.  He began having chest pain approximately 30 minutes ago while watching TV.  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  The pain radiates up his neck and down his left arm.  He describes that the pain comes and goes.  He has taken nitroglycerin x3 for pain.  He also takes daily aspirin.  He denies any shortness of breath.  He does complain of some nausea and feeling clammy.  He reports having history of MI in 1996 with 3 stents.  He denies having any cardiac catheterization since that time.  He reports he is followed by a cardiologist in [place], but cannot recall the name of the cardiologist.  Last cardiac echo was in March.  He denies having any recent chest pain or evaluations for chest pain.  He reports that a friend drove him to the emergency room.    Review of records reveals that the patient was seen on July 30 at [facility] for chest pain.  He was seen on August 2 at [facility] for chest pain.  He was seen at [facility] on August 4 for chest pain.  Care everywhere shows that the patient had an unremarkable cardiac catheterization done at [facility] in 2016.  The note from [facility] from 12/2014 reports "After review the medical records I can see the patient has had multiple evaluations at different facilities. He was actually admitted in the [facility] system and had a clean catheterization yesterday . It was after that discharge that [facility] where [facility] and [facility]. [facility] and [facility] system today. After he left there he came to our ER for evaluation of the same symptoms. It appears from the medical records that he is reporting the same symptoms were ever he presents. It does not seem that his symptoms tonight are different than what he has been reporting at the previous visits."         The history is provided by the patient.   Chest Pain (Angina)    Associated symptoms include diaphoresis and nausea. Pertinent negatives include no abdominal pain, no cough, no fever, no headaches, no shortness of breath, no vomiting and no weakness.        Past Medical History:   Diagnosis Date   ??? Hyperlipemia    ??? Hypertension    ??? MI (myocardial infarction) (HCC)    ??? Mood disorder (HCC) 01/12/2014       History reviewed. No pertinent surgical history.      History reviewed. No pertinent family history.    Social History     Socioeconomic History   ??? Marital status: SINGLE     Spouse name: Not on file   ??? Number of children: Not on file   ??? Years of education: Not on file   ??? Highest education level: Not on file   Occupational History   ??? Not on file   Social Needs   ??? Financial resource strain: Not on file   ??? Food insecurity:     Worry: Not on file     Inability: Not on file   ??? Transportation needs:     Medical: Not on file     Non-medical: Not on file   Tobacco Use   ??? Smoking status: Former Smoker   ??? Smokeless  tobacco: Never Used   Substance and Sexual Activity   ??? Alcohol use: No     Alcohol/week: 0.0 standard drinks   ??? Drug use: No   ??? Sexual activity: Not on file   Lifestyle   ??? Physical activity:     Days per week: Not on file     Minutes per session: Not on file   ??? Stress: Not on file   Relationships   ??? Social connections:     Talks on phone: Not on file     Gets together: Not on file     Attends religious service: Not on file     Active member of club or organization: Not on file     Attends meetings of clubs or organizations: Not on file     Relationship status: Not on file   ??? Intimate partner violence:     Fear of current or ex partner: Not on file     Emotionally abused: Not on file     Physically abused: Not on file     Forced sexual activity: Not on file   Other Topics Concern   ??? Not on file   Social History Narrative   ??? Not on file         ALLERGIES: Pcn [penicillins]    Review of Systems    Constitutional: Positive for diaphoresis. Negative for appetite change and fever.   HENT: Negative for congestion, nosebleeds and sore throat.    Eyes: Negative for discharge and visual disturbance.   Respiratory: Negative for cough and shortness of breath.    Cardiovascular: Positive for chest pain.   Gastrointestinal: Positive for nausea. Negative for abdominal pain, diarrhea and vomiting.   Genitourinary: Negative for dysuria.   Musculoskeletal: Negative.    Skin: Negative for rash.   Neurological: Negative for weakness and headaches.   Hematological: Negative for adenopathy.   Psychiatric/Behavioral: Negative.    All other systems reviewed and are negative.      Vitals:    02/09/18 2345 02/09/18 2346 02/09/18 2353 02/10/18 0001   BP: 120/78   120/78   Pulse: 69 67 69 73   Resp: 14 16 18 20    Temp:    97.8 ??F (36.6 ??C)   SpO2:  94% 97% 97%            Physical Exam   Constitutional: He is oriented to person, place, and time. He appears well-developed and well-nourished.   HENT:   Head: Normocephalic and atraumatic.   Eyes: Conjunctivae are normal.   Neck: Normal range of motion. Neck supple.   Cardiovascular: Normal rate, regular rhythm and normal heart sounds.   Pulmonary/Chest: Effort normal and breath sounds normal.   Abdominal: Soft. Bowel sounds are normal.   Musculoskeletal: Normal range of motion.   Neurological: He is alert and oriented to person, place, and time.   Skin: Skin is warm and dry.   Psychiatric: He has a normal mood and affect.   Nursing note and vitals reviewed.       MDM       Procedures    ED EKG interpretation:  Rhythm: normal sinus rhythm; and regular . Rate (approx.): 80; Axis: normal; P wave: normal; QRS interval: RBBB; ST/T wave: no ST elevation. No significant change from 07/21/2014.    A/P:  1. Chest pain - non-ischemic EKG. Reports hx CAD. I am concerned as the patient did not disclose any recent ED visits when asked when in  fact he  has had 3 in the past week. Recommend cardiology follow-up for further evalaution.      Patient's results have been reviewed with them.  Patient and/or family have verbally conveyed their understanding and agreement of the patient's signs, symptoms, diagnosis, treatment and prognosis and additionally agree to follow up as recommended or return to the Emergency Room should their condition change prior to follow-up.  Discharge instructions have also been provided to the patient with some educational information regarding their diagnosis as well a list of reasons why they would want to return to the ER prior to their follow-up appointment should their condition change.

## 2018-02-10 NOTE — ED Triage Notes (Signed)
Patient arrives from outside [facility] for chest pain. Reports left sided that radiates down arm. Patient was seen last night for the same complaint. Patient states [facility] wouldn't see him without ID. Ambulatory to stretcher. EMS gave one nitro without relief of chest pani.

## 2018-02-10 NOTE — ED Notes (Signed)
Discharge instructions reviewed with pt and copy given by this RN. Pt educated on signs and symptoms of chest pain to monitor for at home and when to return to ED if symptoms worsen. Pt also educated to follow up with cardiology MD. Pt verbalized understanding of all education. Pt ambulatory from ED in no sign of distress or discomfort.

## 2018-02-11 LAB — EKG, 12 LEAD, INITIAL
Atrial Rate: 68 {beats}/min
Calculated P Axis: 63 degrees
Calculated R Axis: 83 degrees
Calculated T Axis: 51 degrees
Diagnosis: NORMAL
P-R Interval: 142 ms
Q-T Interval: 408 ms
QRS Duration: 122 ms
QTC Calculation (Bezet): 433 ms
Ventricular Rate: 68 {beats}/min

## 2018-02-11 LAB — POC TROPONIN-I
POC Troponin I: <0.04 ng/mL (ref 0.00–0.08)
Troponin-I (POC): <0.04 ng/mL (ref 0.00–0.08)

## 2018-02-11 LAB — EKG 12-LEAD
Atrial Rate: 68 {beats}/min
Diagnosis: NORMAL
P Axis: 63 degrees
P-R Interval: 142 ms
Q-T Interval: 408 ms
QRS Duration: 122 ms
QTc Calculation (Bazett): 433 ms
R Axis: 83 degrees
T Axis: 51 degrees
Ventricular Rate: 68 {beats}/min
